# Patient Record
Sex: Female | Born: 1985 | Race: White | Hispanic: No | Marital: Married | Smoking: Never smoker
Health system: Southern US, Community
[De-identification: ages and names within clinical notes are randomized; demographics above are authoritative.]

## PROBLEM LIST (undated history)

## (undated) DIAGNOSIS — Z8619 Personal history of other infectious and parasitic diseases: Secondary | ICD-10-CM

## (undated) DIAGNOSIS — O24419 Gestational diabetes mellitus in pregnancy, unspecified control: Secondary | ICD-10-CM

## (undated) DIAGNOSIS — Z8639 Personal history of other endocrine, nutritional and metabolic disease: Secondary | ICD-10-CM

## (undated) DIAGNOSIS — F329 Major depressive disorder, single episode, unspecified: Secondary | ICD-10-CM

## (undated) DIAGNOSIS — F32A Depression, unspecified: Secondary | ICD-10-CM

## (undated) DIAGNOSIS — E079 Disorder of thyroid, unspecified: Secondary | ICD-10-CM

## (undated) DIAGNOSIS — T7840XA Allergy, unspecified, initial encounter: Secondary | ICD-10-CM

## (undated) HISTORY — DX: Allergy, unspecified, initial encounter: T78.40XA

## (undated) HISTORY — DX: Major depressive disorder, single episode, unspecified: F32.9

## (undated) HISTORY — DX: Disorder of thyroid, unspecified: E07.9

## (undated) HISTORY — DX: Personal history of other endocrine, nutritional and metabolic disease: Z86.39

## (undated) HISTORY — DX: Gestational diabetes mellitus in pregnancy, unspecified control: O24.419

## (undated) HISTORY — DX: Depression, unspecified: F32.A

## (undated) HISTORY — DX: Personal history of other infectious and parasitic diseases: Z86.19

---

## 2011-02-27 HISTORY — PX: OTHER SURGICAL HISTORY: SHX169

## 2016-08-01 DIAGNOSIS — E669 Obesity, unspecified: Secondary | ICD-10-CM | POA: Insufficient documentation

## 2016-08-01 LAB — BASIC METABOLIC PANEL
BUN: 14 (ref 4–21)
Creatinine: 0.8 (ref 0.5–1.1)
Glucose: 91
Potassium: 4.3 (ref 3.4–5.3)
Sodium: 138 (ref 137–147)

## 2016-08-05 LAB — HM PAP SMEAR: HM Pap smear: NEGATIVE

## 2018-08-13 LAB — TSH: TSH: 3.33 (ref <=5.90)

## 2018-10-29 DIAGNOSIS — M5412 Radiculopathy, cervical region: Secondary | ICD-10-CM | POA: Insufficient documentation

## 2018-11-08 ENCOUNTER — Emergency Department: Payer: Managed Care, Other (non HMO)

## 2018-11-08 ENCOUNTER — Other Ambulatory Visit: Payer: Self-pay

## 2018-11-08 ENCOUNTER — Emergency Department
Admission: EM | Admit: 2018-11-08 | Discharge: 2018-11-08 | Disposition: A | Discharge status: Home / Self Care | Payer: Managed Care, Other (non HMO) | Attending: Emergency Medicine | Admitting: Emergency Medicine

## 2018-11-08 ENCOUNTER — Encounter: Payer: Self-pay | Admitting: Emergency Medicine

## 2018-11-08 DIAGNOSIS — M50223 Other cervical disc displacement at C6-C7 level: Secondary | ICD-10-CM | POA: Insufficient documentation

## 2018-11-08 DIAGNOSIS — M5412 Radiculopathy, cervical region: Secondary | ICD-10-CM | POA: Diagnosis not present

## 2018-11-08 DIAGNOSIS — M25511 Pain in right shoulder: Secondary | ICD-10-CM | POA: Diagnosis present

## 2018-11-08 MED ORDER — OXYCODONE-ACETAMINOPHEN 5-325 MG PO TABS: 1.0000 | ORAL_TABLET | ORAL | 0 refills | Status: DC | PRN

## 2018-11-08 MED ORDER — PREDNISONE 10 MG (48) PO TBPK: ORAL_TABLET | ORAL | 0 refills | Status: DC

## 2018-11-08 MED ORDER — ONDANSETRON HCL 4 MG/2ML IJ SOLN
4.0000 mg | Freq: Once | INTRAMUSCULAR | Status: AC
  Administered 2018-11-08: 4 mg via INTRAVENOUS
  Filled 2018-11-08: qty 2

## 2018-11-08 MED ORDER — MORPHINE SULFATE (PF) 4 MG/ML IV SOLN
4.0000 mg | Freq: Once | INTRAVENOUS | Status: AC
  Administered 2018-11-08: 4 mg via INTRAVENOUS
  Filled 2018-11-08: qty 1

## 2018-11-08 MED ORDER — DEXAMETHASONE SODIUM PHOSPHATE 10 MG/ML IJ SOLN
10.0000 mg | Freq: Once | INTRAMUSCULAR | Status: AC
  Administered 2018-11-08: 10 mg via INTRAVENOUS
  Filled 2018-11-08: qty 1

## 2018-11-08 NOTE — ED Notes (Signed)
Pt to ed with c/o neck and right shoulder pain that started Jan 1st, 2020.  Pt denies specific injury.

## 2018-11-08 NOTE — Discharge Instructions (Signed)
Follow-up with [facility].  Please call for an appointment.  Use your medications as prescribed.  Return to emergency department if worsening.  Apply ice to the back of your neck to decrease the swelling.  This may help with some of the nerve pain.

## 2018-11-08 NOTE — ED Provider Notes (Signed)
[facility] Emergency Department Provider Note  ____________________________________________   First MD Initiated Contact with Patient 11/08/18 1005     (approximate)  I have reviewed the triage vital signs and the nursing notes.   HISTORY  Chief Complaint Shoulder Pain    HPI Ann Cooper is a 33 y.o. female presents to the emergency department complaining of pain radiating down the right arm to the thumb.  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  She has taken a Medrol Dosepak, Mobic, muscle relaxer, and tramadol without any relief.  She states it hurts to pick objects up with that hand.  She denies any known injury.    History reviewed. No pertinent past medical history.  There are no active problems to display for this patient.   History reviewed. No pertinent surgical history.  Prior to Admission medications   Medication Sig Start Date End Date Taking? Authorizing Provider  oxyCODONE-acetaminophen (PERCOCET/ROXICET) 5-325 MG tablet Take 1 tablet by mouth every 4 (four) hours as needed for severe pain. 11/08/18   , Roselyn Bering, PA-C  predniSONE (STERAPRED UNI-PAK 48 TAB) 10 MG (48) TBPK tablet Take 6 pills for 2 days, 5 pills x 2d, 4 pills x 2d, 3 pills x 2d, 2 pills x 2d, 1 pill x 2d 11/08/18   Faythe Ghee, PA-C    Allergies Patient has no known allergies.  No family history on file.  Social History Social History   Tobacco Use  . Smoking status: Not on file  Substance Use Topics  . Alcohol use: Not on file  . Drug use: Not on file    Review of Systems  Constitutional: No fever/chills Eyes: No visual changes. ENT: No sore throat. Respiratory: Denies cough Genitourinary: Negative for dysuria. Musculoskeletal: Negative for back pain.  Positive for right shoulder pain with radiation to the right thumb Skin: Negative for rash.    ____________________________________________   PHYSICAL EXAM:  VITAL  SIGNS: ED Triage Vitals [11/08/18 0943]  Enc Vitals Group     BP (!) 144/83     Pulse Rate 95     Resp 20     Temp 98.3 F (36.8 C)     Temp Source Oral     SpO2 100 %     Weight 200 lb (90.7 kg)     Height 5\' 4"  (1.626 m)     Head Circumference      Peak Flow      Pain Score 10     Pain Loc      Pain Edu?      Excl. in GC?     Constitutional: Alert and oriented. Well appearing and in no acute distress. Eyes: Conjunctivae are normal.  Head: Atraumatic. Nose: No congestion/rhinnorhea. Mouth/Throat: Mucous membranes are moist.   Neck:  supple no lymphadenopathy noted Cardiovascular: Normal rate, regular rhythm. Heart sounds are normal Respiratory: Normal respiratory effort.  No retractions, lungs c t a  GU: deferred Musculoskeletal: FROM all extremities, warm and well perfused, C-spine palpation reproduces pain radiating to the right thumb, grip is slightly decreased in the right hand compared to the left Neurologic:  Normal speech and language.  Skin:  Skin is warm, dry and intact. No rash noted. Psychiatric: Mood and affect are normal. Speech and behavior are normal.  ____________________________________________   LABS (all labs ordered are listed, but only abnormal results are displayed)  Labs Reviewed - No data to display ____________________________________________  ____________________________________________  RADIOLOGY  X-ray of C-spine shows degenerative changes at C6-C7 MRI of the C-spine without contrast shows disc bulging more so on the right side.  ____________________________________________   PROCEDURES  Procedure(s) performed: Saline lock, morphine 4 mg IV, Zofran 4 mg IV, Decadron 10 mg IV, repeat 4 mg of morphine prior to discharge.  Procedures    ____________________________________________   INITIAL IMPRESSION / ASSESSMENT AND PLAN / ED COURSE  Pertinent labs & imaging results that were available during my care of the patient were  reviewed by me and considered in my medical decision making (see chart for details).   Patient is a 33 year old female presents emergency department complaining of right shoulder pain that radiates to the right thumb.  Physical exam shows that pain is reproduced with palpation of the C-spine.  It caused pain into the right thumb.  She was given pain medication via IV, morphine 4 mg IV, Zofran 4 mg IV, and Decadron 10 mg IV.  X-ray of the C-spine shows degenerative changes. MRI showed bulging disc at C6-C7 with the bulge being worse on the right side.  These results were discussed with the patient and her husband.  She was given a another dose of morphine prior to discharge as the pain had returned.  She was given a prescription for Sterapred, she is to use the muscle relaxer that the urgent care gave her, and she was also given Percocet for pain.  She is to follow-up with Dr. Marcell Barlow at [facility].  She is to call make an appointment.  She states she understands will comply.  She was discharged in stable condition in the care of her husband.     As part of my medical decision making, I reviewed the following data within the electronic MEDICAL RECORD NUMBER History obtained from family, Nursing notes reviewed and incorporated, Old chart reviewed, Radiograph reviewed x-ray of C-spine shows degenerative changes, MRI shows disc bulging, Notes from prior ED visits and [place] Controlled Substance Database  ____________________________________________   FINAL CLINICAL IMPRESSION(S) / ED DIAGNOSES  Final diagnoses:  Cervical radiculitis      NEW MEDICATIONS STARTED DURING THIS VISIT:  Discharge Medication List as of 11/08/2018  2:02 PM    START taking these medications   Details  oxyCODONE-acetaminophen (PERCOCET/ROXICET) 5-325 MG tablet Take 1 tablet by mouth every 4 (four) hours as needed for severe pain., Starting Sat 11/08/2018, Normal    predniSONE (STERAPRED UNI-PAK 48 TAB) 10 MG (48)  TBPK tablet Take 6 pills for 2 days, 5 pills x 2d, 4 pills x 2d, 3 pills x 2d, 2 pills x 2d, 1 pill x 2d, Normal         Note:  This document was prepared using Dragon voice recognition software and may include unintentional dictation errors.    Faythe Ghee, PA-C 11/08/18 1536    Don Perking, [place], MD 11/09/18 [phone]

## 2018-11-08 NOTE — ED Triage Notes (Signed)
R shoulder pain radiating down R arm since January 1. Increases with movement. Denies injury at onset.

## 2018-11-27 ENCOUNTER — Ambulatory Visit: Payer: Self-pay | Admitting: Family Medicine

## 2018-12-16 ENCOUNTER — Ambulatory Visit: Payer: Managed Care, Other (non HMO) | Admitting: Family Medicine

## 2018-12-16 ENCOUNTER — Encounter: Payer: Self-pay | Admitting: Family Medicine

## 2018-12-16 VITALS — BP 114/72 | HR 91 | Temp 98.6°F | Ht 64.25 in | Wt 201.5 lb

## 2018-12-16 DIAGNOSIS — M5412 Radiculopathy, cervical region: Secondary | ICD-10-CM | POA: Diagnosis not present

## 2018-12-16 DIAGNOSIS — Z3169 Encounter for other general counseling and advice on procreation: Secondary | ICD-10-CM | POA: Diagnosis not present

## 2018-12-16 NOTE — Patient Instructions (Signed)
You are doing great with exercise and healthy eating!   Good luck with getting pregnant. If you get to 6 months you may want to consider using an App to track your cycle and ovulation.   Let me or you OB/GYN know if you reach 1 year of trying without conceiving  Continue taking prenatal vitamins

## 2018-12-16 NOTE — Progress Notes (Signed)
Subjective:     Ann Cooper is a 33 y.o. female presenting for Establish Care (previous PCP in [place] and last seen there 2010. Has been seen GYN in [place] and specialists.); ER follow up (was seen at ER on 11/08/2018 and was diagnosed with cervical radiculitis, bulging disc C6-C7. Has neurosurgeon appointment scheduled. Muscle relaxer is helping.); and History of Hypothyroidism (pt states she was diagnosed in 2014 and was on medication. She was not consistent with taking medication for this and eventually stopped. last time TSH was checked she was told it was normal. She is wondering if this needs to be re checked.)     HPI  #C6-7 radiculitis - neck feels better - seeing neurosurgery - feels like her mid-back will feel tingling or seize up occasionally - taking muscle relaxer's which is helping - happens a few times a week -   #Hx of Hypothyroidism - [facility] exam and numbers were "off" - was on a very low dose of 25 mcg and she forgot to take it frequently - last year the blood work was normal  # currently trying to become pregnant - started trying in October - not following her cycle currently - taking a prenatal vitamin - has regular cycles -  Review of Systems  Constitutional: Negative for chills and fever.  Respiratory: Negative for shortness of breath.   Cardiovascular: Negative for chest pain and palpitations.  Gastrointestinal: Negative for abdominal pain.  Neurological: Negative for weakness and numbness.     Social History   Tobacco Use  Smoking Status Never Smoker  Smokeless Tobacco Never Used        Objective:    BP Readings from Last 3 Encounters:  12/16/18 114/72  11/08/18 132/71   Wt Readings from Last 3 Encounters:  12/16/18 201 lb 8 oz (91.4 kg)  11/08/18 200 lb (90.7 kg)  11/08/18 200 lb (90.7 kg)    BP 114/72   Pulse 91   Temp 98.6 F (37 C)   Ht 5' 4.25" (1.632 m)   Wt 201 lb 8 oz (91.4 kg)   LMP  12/11/2018   SpO2 98%   BMI 34.32 kg/m    Physical Exam Constitutional:      General: She is not in acute distress.    Appearance: She is well-developed. She is not diaphoretic.  HENT:     Right Ear: External ear normal.     Left Ear: External ear normal.     Nose: Nose normal.  Eyes:     Conjunctiva/sclera: Conjunctivae normal.  Neck:     Musculoskeletal: Neck supple.  Cardiovascular:     Rate and Rhythm: Normal rate and regular rhythm.     Heart sounds: No murmur.  Pulmonary:     Effort: Pulmonary effort is normal. No respiratory distress.     Breath sounds: Normal breath sounds. No wheezing.  Skin:    General: Skin is warm and dry.     Capillary Refill: Capillary refill takes less than 2 seconds.  Neurological:     Mental Status: She is alert. Mental status is at baseline.  Psychiatric:        Mood and Affect: Mood normal.        Behavior: Behavior normal.           Assessment & Plan:   Problem List Items Addressed This Visit      Nervous and Auditory   Cervical radiculopathy - Primary  S/p steroid injection w/ improvement. Following with neurosurgery.       Relevant Medications   tiZANidine (ZANAFLEX) 2 MG tablet    Other Visit Diagnoses    Encounter for preconception consultation         Discussed healthy diet/exercise and prenatal vitamins. Also discussed using App for ovulation tracking and following up if no pregnancy in 1 year to discuss referral options to be evaluated.   Recent TSH normal. No need for repeat. Could repeat annually or when pregnant.  Return in about 1 year (around 12/17/2019).  Lynnda Child, MD

## 2018-12-16 NOTE — Assessment & Plan Note (Signed)
S/p steroid injection w/ improvement. Following with neurosurgery.

## 2018-12-29 ENCOUNTER — Encounter: Payer: Self-pay | Admitting: Family Medicine

## 2018-12-29 NOTE — Telephone Encounter (Signed)
Pulled Flonase down for refill.

## 2018-12-29 NOTE — Telephone Encounter (Signed)
See other mychart message, patient does not need this at this time.

## 2019-01-12 ENCOUNTER — Encounter: Payer: Self-pay | Admitting: Family Medicine

## 2019-08-24 LAB — RESULTS CONSOLE HPV: CHL HPV: NEGATIVE

## 2019-08-24 LAB — HM PAP SMEAR: HM Pap smear: NEGATIVE

## 2019-11-05 ENCOUNTER — Encounter: Payer: Self-pay | Admitting: Family Medicine

## 2019-11-05 NOTE — Telephone Encounter (Signed)
Dr. Selena Batten, patient is scheduled for 11/19/2019 for CPE. Can these questions be addressed at that time or needs another appointment for this?

## 2019-11-19 ENCOUNTER — Encounter: Payer: Managed Care, Other (non HMO) | Admitting: Family Medicine

## 2019-11-26 ENCOUNTER — Ambulatory Visit (INDEPENDENT_AMBULATORY_CARE_PROVIDER_SITE_OTHER): Payer: Managed Care, Other (non HMO) | Admitting: Family Medicine

## 2019-11-26 ENCOUNTER — Other Ambulatory Visit: Payer: Self-pay

## 2019-11-26 ENCOUNTER — Encounter: Payer: Self-pay | Admitting: Family Medicine

## 2019-11-26 VITALS — BP 118/80 | HR 94 | Temp 98.3°F | Ht 64.0 in | Wt 204.0 lb

## 2019-11-26 DIAGNOSIS — Z0000 Encounter for general adult medical examination without abnormal findings: Secondary | ICD-10-CM | POA: Diagnosis not present

## 2019-11-26 DIAGNOSIS — M5412 Radiculopathy, cervical region: Secondary | ICD-10-CM

## 2019-11-26 DIAGNOSIS — E669 Obesity, unspecified: Secondary | ICD-10-CM | POA: Diagnosis not present

## 2019-11-26 DIAGNOSIS — N946 Dysmenorrhea, unspecified: Secondary | ICD-10-CM | POA: Insufficient documentation

## 2019-11-26 DIAGNOSIS — Z23 Encounter for immunization: Secondary | ICD-10-CM

## 2019-11-26 MED ORDER — TIZANIDINE HCL 2 MG PO TABS: 2.0000 mg | ORAL_TABLET | Freq: Four times a day (QID) | ORAL | 1 refills | Status: DC | PRN

## 2019-11-26 MED ORDER — NAPROXEN 250 MG PO TABS: 250.0000 mg | ORAL_TABLET | Freq: Two times a day (BID) | ORAL | 1 refills | Status: DC

## 2019-11-26 NOTE — Progress Notes (Signed)
Annual Exam   Chief Complaint:  Chief Complaint  Patient presents with  . Annual Exam    History of Present Illness:  [name] is a 34 y.o. No obstetric history on file. who LMP was Patient's last menstrual period was 11/09/2019., presents today for her annual examination.      Nutrition/Lifestyle Diet: tries to eat healthy Exercise: not currently exercising She does get adequate calcium and Vitamin D in her diet.  Social History   Tobacco Use  Smoking Status Never Smoker  Smokeless Tobacco Never Used   Social History   Substance and Sexual Activity  Alcohol Use Yes   Comment: once a month   Social History   Substance and Sexual Activity  Drug Use Never     Safety The patient wears seatbelts: yes.     The patient feels safe at home and in their relationships: yes.  General Health Dentist in the last year: Yes Eye doctor: yes   Cervical Cancer Screening (Age 49-65) Last Pap:  08/24/2019 - normal, neg HPV  Family History of Breast Cancer: no Family History of Ovarian Cancer: no   Sexual Health/Menses Her menses are regular every 28-30 days, lasting 5 day(s).  Dysmenorrhea severe, occurring starts 10 days before and stop with cycle. She does not have intermenstrual bleeding.  Pain feels like period cramps. Taking ibuprofen 800 mg every 4-6 hours. She had a normal [place].  Has been trying to conceive x 3 months, off birth control since May 2019  She is single partner, contraception - currently trying to get pregnant.   Weight Wt Readings from Last 3 Encounters:  11/26/19 204 lb (92.5 kg)  12/16/18 201 lb 8 oz (91.4 kg)  11/08/18 200 lb (90.7 kg)   Patient has high BMI  BMI Readings from Last 1 Encounters:  11/26/19 35.02 kg/m     Chronic disease screening Blood pressure monitoring:  BP Readings from Last 3 Encounters:  11/26/19 118/80  12/16/18 114/72  11/08/18 132/71     Lipid Monitoring: Indication for screening: age >12, obesity,  diabetes, family hx, CV risk factors.  Lipid screening: Yes  No results found for: CHOL, HDL, LDLCALC, LDLDIRECT, TRIG, CHOLHDL   Diabetes Screening: age >61, overweight, family hx, PCOS, hx of gestational diabetes, at risk ethnicity, elevated blood pressure >135/80.  Diabetes Screening screening: Yes  No results found for: HGBA1C    Past Medical History:  Diagnosis Date  . Allergy   . Depression   . History of chicken pox   . History of hypothyroidism   . Thyroid disease     Past Surgical History:  Procedure Laterality Date  . NPFL left knee  02/2011   and menscus repair    Prior to Admission medications   Medication Sig Start Date End Date Taking? Authorizing Provider  fluticasone (FLONASE) 50 MCG/ACT nasal spray Place 1 spray into the nose daily as needed.  01/15/18  Yes [provider]  meloxicam (MOBIC) 15 MG tablet Take 15 mg by mouth daily. 11/18/19  Yes [provider]  Prenatal Vit-Fe Fumarate-FA (PRENATAL FA PO) Take by mouth daily.   Yes [provider]  tiZANidine (ZANAFLEX) 2 MG tablet Take by mouth every 6 (six) hours as needed for muscle spasms.   Yes [provider]    Allergies  Allergen Reactions  . Tree Extract     Tree nuts- ear and throat gets itchy.     Gynecologic History: Patient's last menstrual period was 11/09/2019.  Obstetric History: No obstetric history on file.  Social History   Socioeconomic History  . Marital status: Married    Spouse name: Shanon Brow  . Number of children: Not on file  . Years of education: Bachelors degree  . Highest education level: Not on file  Occupational History  . Not on file  Tobacco Use  . Smoking status: Never Smoker  . Smokeless tobacco: Never Used  Substance and Sexual Activity  . Alcohol use: Yes    Comment: once a month  . Drug use: Never  . Sexual activity: Yes    Birth control/protection: None  Other Topics Concern  . Not on file  Social History Narrative    Lives with Shanon Brow   Pets: dog, cat, and bird   Works at [employer]   Enjoys: walks with the dog, watching sports, puzzles   Exercise: started back at the [facility] - 5 times a week, walking on the treadmill 3.2 miles   Diet: pretty healthy, whole foods delivery - low carbs, meat/fruit/veggies   Social Determinants of Health   Financial Resource Strain: Low Risk   . Difficulty of Paying Living Expenses: Not hard at all  Food Insecurity:   . Worried About Charity fundraiser in the Last Year: Not on file  . Ran Out of Food in the Last Year: Not on file  Transportation Needs:   . Lack of Transportation (Medical): Not on file  . Lack of Transportation (Non-Medical): Not on file  Physical Activity:   . Days of Exercise per Week: Not on file  . Minutes of Exercise per Session: Not on file  Stress:   . Feeling of Stress : Not on file  Social Connections:   . Frequency of Communication with Friends and Family: Not on file  . Frequency of Social Gatherings with Friends and Family: Not on file  . Attends Religious Services: Not on file  . Active Member of Clubs or Organizations: Not on file  . Attends Archivist Meetings: Not on file  . Marital Status: Not on file  Intimate Partner Violence:   . Fear of Current or Ex-Partner: Not on file  . Emotionally Abused: Not on file  . Physically Abused: Not on file  . Sexually Abused: Not on file    Family History  Problem Relation Age of Onset  . Arthritis Mother   . Asthma Mother   . Depression Mother   . Miscarriages / [place] Mother   . Asthma Sister   . Depression Sister   . Arthritis Maternal Grandmother   . Asthma Maternal Grandmother   . Depression Maternal Grandmother   . Scoliosis Maternal Grandmother   . Alcohol abuse Maternal Grandfather   . Diabetes Maternal Grandfather     ROS   Physical Exam BP 118/80   Pulse 94   Temp 98.3 F (36.8 C)   Ht 5\' 4"  (1.626 m)   Wt 204 lb (92.5 kg)   LMP 11/09/2019   SpO2  97%   BMI 35.02 kg/m    BP Readings from Last 3 Encounters:  11/26/19 118/80  12/16/18 114/72  11/08/18 132/71    Wt Readings from Last 3 Encounters:  11/26/19 204 lb (92.5 kg)  12/16/18 201 lb 8 oz (91.4 kg)  11/08/18 200 lb (90.7 kg)     Physical Exam    Results: Depression screen [facility] 2/9 12/16/2018  Decreased Interest 0  Down, Depressed, Hopeless 0  PHQ - 2 Score 0  Altered  sleeping 0  Tired, decreased energy 0  Change in appetite 0  Feeling bad or failure about yourself  0  Trouble concentrating 0  Moving slowly or fidgety/restless 0  Suicidal thoughts 0  PHQ-9 Score 0  Difficult doing work/chores Not difficult at all      Assessment: 34 y.o. No obstetric history on file. female here for routine annual examination.  Plan: Problem List Items Addressed This Visit      Nervous and Auditory   Cervical radiculopathy   Relevant Medications   tiZANidine (ZANAFLEX) 2 MG tablet   Other Relevant Orders   Comprehensive metabolic panel   Hemoglobin A1c   Lipid panel     Other   Obesity (BMI 30.0-34.9)   Relevant Orders   Comprehensive metabolic panel   Hemoglobin A1c   Lipid panel   Menstrual cramp    Pt taking meloxicam for another injury - recommended only this for her next cycle. Trying to get pregnant so not a candidate for birth control at this time. If no improvement will trial Naproxen and consider opiate for severe symptoms. Also discussed possibility of Pelvic PT for help, though denies chronic symptoms.       Relevant Medications   naproxen (NAPROSYN) 250 MG tablet   Other Relevant Orders   Comprehensive metabolic panel   Hemoglobin A1c   Lipid panel    Other Visit Diagnoses    Annual physical exam    -  Primary   Relevant Orders   Comprehensive metabolic panel   Hemoglobin A1c   Lipid panel       Screening: -- Blood pressure screen normal -- cholesterol screening: will obtain -- Weight screening: overweight: continue to monitor --  Diabetes Screening: will obtain -- Nutrition: normal - encouraged healthy diet   Psych -- Depression screening (PHQ-9):    Office Visit from 12/16/2018 in [facility]  PHQ-9 Total Score  0       Safety -- tobacco screening: not using -- alcohol screening:  low-risk usage. -- no evidence of domestic violence or intimate partner violence.   Cancer Screening -- pap smear not collected per ASCCP guidelines -- family history of breast cancer screening: done. not at high risk.   Immunizations -- flu vaccine up to date -- TDAP q10 years up to date  Encouraged regular vision and dental and healthy eating   Lynnda Child

## 2019-11-26 NOTE — Patient Instructions (Signed)
#Menstrual Cramps - This time, take the Meloxicam (once daily) - take Tylenol 1000 mg every 8 hours as needed   - Do not take ibuprofen or naproxen while on Meloxicam - if no improvement on meloxicam - Then try 250-500 mg of Naproxen twice daily with next episode  I will let you know if the PT I know thinks that pelvic floor PT would be helpful   Preventive Care 51-36 Years Old, Female Preventive care refers to visits with your health care provider and lifestyle choices that can promote health and wellness. This includes:  A yearly physical exam. This may also be called an annual well check.  Regular dental visits and eye exams.  Immunizations.  Screening for certain conditions.  Healthy lifestyle choices, such as eating a healthy diet, getting regular exercise, not using drugs or products that contain nicotine and tobacco, and limiting alcohol use. What can I expect for my preventive care visit? Physical exam Your health care provider will check your:  Height and weight. This may be used to calculate body mass index (BMI), which tells if you are at a healthy weight.  Heart rate and blood pressure.  Skin for abnormal spots. Counseling Your health care provider may ask you questions about your:  Alcohol, tobacco, and drug use.  Emotional well-being.  Home and relationship well-being.  Sexual activity.  Eating habits.  Work and work Statistician.  Method of birth control.  Menstrual cycle.  Pregnancy history. What immunizations do I need?  Influenza (flu) vaccine  This is recommended every year. Tetanus, diphtheria, and pertussis (Tdap) vaccine  You may need a Td booster every 10 years. Varicella (chickenpox) vaccine  You may need this if you have not been vaccinated. Human papillomavirus (HPV) vaccine  If recommended by your health care provider, you may need three doses over 6 months. Measles, mumps, and rubella (MMR) vaccine  You may need at least  one dose of MMR. You may also need a second dose. Meningococcal conjugate (MenACWY) vaccine  One dose is recommended if you are age 10-21 years and a first-year college student living in a residence hall, or if you have one of several medical conditions. You may also need additional booster doses. Pneumococcal conjugate (PCV13) vaccine  You may need this if you have certain conditions and were not previously vaccinated. Pneumococcal polysaccharide (PPSV23) vaccine  You may need one or two doses if you smoke cigarettes or if you have certain conditions. Hepatitis A vaccine  You may need this if you have certain conditions or if you travel or work in places where you may be exposed to hepatitis A. Hepatitis B vaccine  You may need this if you have certain conditions or if you travel or work in places where you may be exposed to hepatitis B. Haemophilus influenzae type b (Hib) vaccine  You may need this if you have certain conditions. You may receive vaccines as individual doses or as more than one vaccine together in one shot (combination vaccines). Talk with your health care provider about the risks and benefits of combination vaccines. What tests do I need?  Blood tests  Lipid and cholesterol levels. These may be checked every 5 years starting at age 67.  Hepatitis C test.  Hepatitis B test. Screening  Diabetes screening. This is done by checking your blood sugar (glucose) after you have not eaten for a while (fasting).  Sexually transmitted disease (STD) testing.  BRCA-related cancer screening. This may be done if you  have a family history of breast, ovarian, tubal, or peritoneal cancers.  Pelvic exam and Pap test. This may be done every 3 years starting at age 65. Starting at age 92, this may be done every 5 years if you have a Pap test in combination with an HPV test. Talk with your health care provider about your test results, treatment options, and if necessary, the need  for more tests. Follow these instructions at home: Eating and drinking   Eat a diet that includes fresh fruits and vegetables, whole grains, lean protein, and low-fat dairy.  Take vitamin and mineral supplements as recommended by your health care provider.  Do not drink alcohol if: ? Your health care provider tells you not to drink. ? You are pregnant, may be pregnant, or are planning to become pregnant.  If you drink alcohol: ? Limit how much you have to 0-1 drink a day. ? Be aware of how much alcohol is in your drink. In the [place] one drink equals one 12 oz bottle of beer (355 mL), one 5 oz glass of wine (148 mL), or one 1 oz glass of hard liquor (44 mL). Lifestyle  Take daily care of your teeth and gums.  Stay active. Exercise for at least 30 minutes on 5 or more days each week.  Do not use any products that contain nicotine or tobacco, such as cigarettes, e-cigarettes, and chewing tobacco. If you need help quitting, ask your health care provider.  If you are sexually active, practice safe sex. Use a condom or other form of birth control (contraception) in order to prevent pregnancy and STIs (sexually transmitted infections). If you plan to become pregnant, see your health care provider for a preconception visit. What's next?  Visit your health care provider once a year for a well check visit.  Ask your health care provider how often you should have your eyes and teeth checked.  Stay up to date on all vaccines. This information is not intended to replace advice given to you by your health care provider. Make sure you discuss any questions you have with your health care provider. Document Revised: 06/26/2018 Document Reviewed: 06/26/2018 Elsevier Patient Education  2020 [employer].

## 2019-11-26 NOTE — Assessment & Plan Note (Signed)
Pt taking meloxicam for another injury - recommended only this for her next cycle. Trying to get pregnant so not a candidate for birth control at this time. If no improvement will trial Naproxen and consider opiate for severe symptoms. Also discussed possibility of Pelvic PT for help, though denies chronic symptoms.

## 2019-11-27 ENCOUNTER — Encounter: Payer: Self-pay | Admitting: Family Medicine

## 2019-11-27 LAB — COMPREHENSIVE METABOLIC PANEL
ALT: 60 IU/L — ABNORMAL HIGH (ref 0–32)
AST: 31 IU/L (ref 0–40)
Albumin/Globulin Ratio: 2.1 (ref 1.2–2.2)
Albumin: 4.8 g/dL (ref 3.8–4.8)
Alkaline Phosphatase: 62 IU/L (ref 39–117)
BUN/Creatinine Ratio: 17 (ref 9–23)
BUN: 12 mg/dL (ref 6–20)
Bilirubin Total: 0.4 mg/dL (ref 0.0–1.2)
CO2: 22 mmol/L (ref 20–29)
Calcium: 9.8 mg/dL (ref 8.7–10.2)
Chloride: 102 mmol/L (ref 96–106)
Creatinine, Ser: 0.71 mg/dL (ref 0.57–1.00)
GFR calc Af Amer: 127 mL/min/{1.73_m2} (ref >59)
GFR calc non Af Amer: 110 mL/min/{1.73_m2} (ref >59)
Globulin, Total: 2.3 g/dL (ref 1.5–4.5)
Glucose: 91 mg/dL (ref 65–99)
Potassium: 4.2 mmol/L (ref 3.5–5.2)
Sodium: 140 mmol/L (ref 134–144)
Total Protein: 7.1 g/dL (ref 6.0–8.5)

## 2019-11-27 LAB — LIPID PANEL
Chol/HDL Ratio: 3.2 ratio (ref 0.0–4.4)
Cholesterol, Total: 225 mg/dL — ABNORMAL HIGH (ref 100–199)
HDL: 71 mg/dL (ref >39)
LDL Chol Calc (NIH): 135 mg/dL — ABNORMAL HIGH (ref 0–99)
Triglycerides: 107 mg/dL (ref 0–149)
VLDL Cholesterol Cal: 19 mg/dL (ref 5–40)

## 2019-11-27 LAB — HEMOGLOBIN A1C
Est. average glucose Bld gHb Est-mCnc: 114 mg/dL
Hgb A1c MFr Bld: 5.6 % (ref 4.8–5.6)

## 2019-12-07 ENCOUNTER — Encounter: Payer: Self-pay | Admitting: Family Medicine

## 2020-01-18 ENCOUNTER — Encounter: Payer: Self-pay | Admitting: Family Medicine

## 2020-01-18 DIAGNOSIS — N946 Dysmenorrhea, unspecified: Secondary | ICD-10-CM

## 2020-01-18 MED ORDER — MELOXICAM 15 MG PO TABS: 15.0000 mg | ORAL_TABLET | Freq: Every day | ORAL | 3 refills | Status: DC

## 2020-01-18 NOTE — Addendum Note (Signed)
Addended by: Gweneth Dimitri R on: 01/18/2020 02:00 PM   Modules accepted: Orders

## 2020-06-03 LAB — OB RESULTS CONSOLE HEPATITIS B SURFACE ANTIGEN: Hepatitis B Surface Ag: NEGATIVE

## 2020-06-03 LAB — OB RESULTS CONSOLE VARICELLA ZOSTER ANTIBODY, IGG: Varicella: IMMUNE

## 2020-06-18 LAB — OB RESULTS CONSOLE RUBELLA ANTIBODY, IGM: Rubella: IMMUNE

## 2020-07-18 ENCOUNTER — Other Ambulatory Visit: Payer: Managed Care, Other (non HMO)

## 2020-07-18 ENCOUNTER — Other Ambulatory Visit: Payer: Self-pay

## 2020-07-18 DIAGNOSIS — Z20822 Contact with and (suspected) exposure to covid-19: Secondary | ICD-10-CM

## 2020-07-19 LAB — SARS-COV-2, NAA 2 DAY TAT

## 2020-07-19 LAB — NOVEL CORONAVIRUS, NAA: SARS-CoV-2, NAA: NOT DETECTED

## 2020-07-22 ENCOUNTER — Other Ambulatory Visit: Payer: Managed Care, Other (non HMO)

## 2020-07-25 ENCOUNTER — Other Ambulatory Visit: Payer: Managed Care, Other (non HMO)

## 2020-07-25 DIAGNOSIS — Z20822 Contact with and (suspected) exposure to covid-19: Secondary | ICD-10-CM

## 2020-07-26 LAB — SARS-COV-2, NAA 2 DAY TAT

## 2020-07-26 LAB — NOVEL CORONAVIRUS, NAA: SARS-CoV-2, NAA: NOT DETECTED

## 2020-09-30 ENCOUNTER — Encounter: Payer: Self-pay | Admitting: *Deleted

## 2020-09-30 ENCOUNTER — Encounter: Payer: Managed Care, Other (non HMO) | Attending: Obstetrics & Gynecology | Admitting: *Deleted

## 2020-09-30 ENCOUNTER — Other Ambulatory Visit: Payer: Self-pay

## 2020-09-30 VITALS — BP 120/68 | Ht 64.0 in | Wt 212.6 lb

## 2020-09-30 DIAGNOSIS — O24410 Gestational diabetes mellitus in pregnancy, diet controlled: Secondary | ICD-10-CM

## 2020-09-30 DIAGNOSIS — O24419 Gestational diabetes mellitus in pregnancy, unspecified control: Secondary | ICD-10-CM | POA: Diagnosis not present

## 2020-09-30 NOTE — Patient Instructions (Signed)
Read booklet on Gestational Diabetes Follow Gestational Meal Planning Guidelines Avoid sugar sweetened drinks (juice) Complete a 3 Day Food Record and bring to next appointment Check blood sugars 4 x day - before breakfast and 2 hrs after every meal and record  Bring blood sugar log to all appointments Call MD for prescription for meter strips and lancets Strips  One Touch Verio Lancets   One Touch Delica Plus Purchase urine ketone strips if instructed by MD and check urine ketones every am:  If + increase bedtime snack to 1 protein and 2 carbohydrate servings Walk 20-30 minutes at least 5 x week if permitted by MD

## 2020-09-30 NOTE — Progress Notes (Signed)
Diabetes Self-Management Education  Visit Type: First/Initial  Appt. Start Time: 1330 Appt. End Time: 1500  09/30/2020  [name], identified by name and date of birth, is a 34 y.o. female with a diagnosis of Diabetes: Gestational Diabetes.   ASSESSMENT  Blood pressure 120/68, height 5\' 4"  (1.626 m), weight 212 lb 9.6 oz (96.4 kg), last menstrual period 03/17/2020, estimated date of delivery 12/22/2020 Body mass index is 36.49 kg/m.   Diabetes Self-Management Education - 09/30/20 1432      Visit Information   Visit Type First/Initial      Initial Visit   Diabetes Type Gestational Diabetes    Are you currently following a meal plan? Yes    What type of meal plan do you follow? "low carbs"    Are you taking your medications as prescribed? Yes    Date Diagnosed 1 week ago      Health Coping   How would you rate your overall health? Fair      Psychosocial Assessment   Patient Belief/Attitude about Diabetes Motivated to manage diabetes   "annoyed"   Self-care barriers None    Self-management support Doctor's office;Family    Other persons present Spouse/SO    Patient Concerns Nutrition/Meal planning;Glycemic Control;Monitoring;Weight Control;Healthy Lifestyle    Special Needs None    Preferred Learning Style Visual;Hands on    Learning Readiness Change in progress    How often do you need to have someone help you when you read instructions, pamphlets, or other written materials from your doctor or pharmacy? 1 - Never    What is the last grade level you completed in school? Bachelors      Pre-Education Assessment   Patient understands the diabetes disease and treatment process. Needs Instruction    Patient understands incorporating nutritional management into lifestyle. Needs Instruction    Patient undertands incorporating physical activity into lifestyle. Needs Instruction    Patient understands using medications safely. Needs Instruction    Patient understands  monitoring blood glucose, interpreting and using results Needs Instruction    Patient understands prevention, detection, and treatment of acute complications. Needs Instruction    Patient understands prevention, detection, and treatment of chronic complications. Needs Instruction    Patient understands how to develop strategies to address psychosocial issues. Needs Instruction    Patient understands how to develop strategies to promote health/change behavior. Needs Instruction      Complications   Last HgB A1C per patient/outside source 5.5 %   06/03/2020   How often do you check your blood sugar? 0 times/day (not testing)   Provided One Touch Verio Flex meter and instructed on use. BG upon return demonstration was 79 mg/dL at 08/03/2020 pm - 2 hrs pp.   Have you had a dilated eye exam in the past 12 months? No    Have you had a dental exam in the past 12 months? No    Are you checking your feet? No      Dietary Intake   Breakfast eggs, bacon and avocado, whole wheat toast    Snack (morning) chips, boiled egg and cheese; 1:66 yogurt, fruit (apple, watermelon, oranges    Lunch frozen meal, grilled chicken salad, tuna fish    Dinner chicken, fish; potatoes, peas, beans, corn, rice, occasional pasta, lettuce, carrots, cuccumbers, broccolli, cauliflower, breen beans    Beverage(s) water, juice, coffee, unsweetened tea      Exercise   Exercise Type Light (walking / raking leaves)    How many  days per week to you exercise? 4    How many minutes per day do you exercise? 20    Total minutes per week of exercise 80      Patient Education   Previous Diabetes Education No    Disease state  Definition of diabetes, type 1 and 2, and the diagnosis of diabetes;Factors that contribute to the development of diabetes    Nutrition management  Role of diet in the treatment of diabetes and the relationship between the three main macronutrients and blood glucose level;Food label reading, portion sizes and measuring  food.;Reviewed blood glucose goals for pre and post meals and how to evaluate the patients' food intake on their blood glucose level.    Physical activity and exercise  Role of exercise on diabetes management, blood pressure control and cardiac health.    Medications Other (comment)   Limited use of oral medications during pregnancy and potential for insulin.   Monitoring Taught/evaluated SMBG meter.;Purpose and frequency of SMBG.;Taught/discussed recording of test results and interpretation of SMBG.;Identified appropriate SMBG and/or A1C goals.;Ketone testing, when, how.    Chronic complications Relationship between chronic complications and blood glucose control    Psychosocial adjustment Identified and addressed patients feelings and concerns about diabetes    Preconception care Pregnancy and GDM  Role of pre-pregnancy blood glucose control on the development of the fetus;Reviewed with patient blood glucose goals with pregnancy;Role of family planning for patients with diabetes      Individualized Goals (developed by patient)   Reducing Risk Other (comment)   improve blood sugars, prevent diabetes complications, lose weight, lead a healthier lifestyle, become more fit     Outcomes   Expected Outcomes Demonstrated interest in learning. Expect positive outcomes           Individualized Plan for Diabetes Self-Management Training:   Learning Objective:  Patient will have a greater understanding of diabetes self-management. Patient education plan is to attend individual and/or group sessions per assessed needs and concerns.   Plan:   Patient Instructions  Read booklet on Gestational Diabetes Follow Gestational Meal Planning Guidelines Avoid sugar sweetened drinks (juice) Complete a 3 Day Food Record and bring to next appointment Check blood sugars 4 x day - before breakfast and 2 hrs after every meal and record  Bring blood sugar log to all appointments Call MD for prescription for  meter strips and lancets Strips  One Touch Verio Lancets   One Touch Delica Plus Purchase urine ketone strips if instructed by MD and check urine ketones every am:  If + increase bedtime snack to 1 protein and 2 carbohydrate servings Walk 20-30 minutes at least 5 x week if permitted by MD  Expected Outcomes:  Demonstrated interest in learning. Expect positive outcomes  Education material provided:  Gestational Booklet Gestational Meal Planning Guidelines Simple Meal Plan Viewed Gestational Diabetes Video Meter = One Touch Verio Flex 3 Day Food Record Goals for a Healthy Pregnancy  If problems or questions, patient to contact team via:  Sharion Settler, RN, CCM, CDCES [phone]  Future DSME appointment:  October 06, 2020 with the dietitian

## 2020-10-06 ENCOUNTER — Encounter: Payer: Managed Care, Other (non HMO) | Admitting: Dietician

## 2020-10-06 ENCOUNTER — Other Ambulatory Visit: Payer: Self-pay

## 2020-10-06 ENCOUNTER — Encounter: Payer: Self-pay | Admitting: Dietician

## 2020-10-06 VITALS — BP 98/60 | Ht 64.0 in | Wt 212.5 lb

## 2020-10-06 DIAGNOSIS — O24419 Gestational diabetes mellitus in pregnancy, unspecified control: Secondary | ICD-10-CM | POA: Diagnosis not present

## 2020-10-06 DIAGNOSIS — O24410 Gestational diabetes mellitus in pregnancy, diet controlled: Secondary | ICD-10-CM

## 2020-10-06 NOTE — Progress Notes (Signed)
.   Patient's BG record indicates fasting BGs ranging 100-112 , and post-meal BGs ranging 83-157 . Patient's food diary indicates good nutrient balance at meals, some snacks missing protein source  . Provided individualized menu ideas based on patient's food preferences. . Instructed patient on food safety, including avoidance of Listeriosis, and limiting mercury from fish. . Discussed importance of maintaining healthy lifestyle habits to reduce risk of Type 2 DM as well as Gestational DM with any future pregnancies. . Advised patient to use any remaining testing supplies to test some BGs after delivery, and to have BG tested ideally annually, as well as prior to attempting future pregnancies.

## 2020-10-06 NOTE — Patient Instructions (Signed)
   Try having an evening snack of carb+ protein to see if that helps with FBGs   When snacking remember to include a protein source

## 2020-10-29 NOTE — L&D Delivery Note (Signed)
Delivery Note At 2:20 AM a viable female was delivered via Vaginal, Spontaneous (Presentation: Left Occiput Anterior).  APGAR: 8, 9; weight  .   Placenta status: Spontaneous, Intact.  Cord: 3 vessels with the following complications: None.  Cord pH:  Short second stage . Controlled delivery . Shoulder delivered without difficulty . Delayed cord clamping  Placenta shortly after . Repair uncomplicated  Anesthesia: Epidural Episiotomy: None Lacerations: 2nd degree;Perineal Suture Repair: 2.0 3.0 vicryl Est. Blood Loss (mL):  100 QBL pending  Mom to postpartum.  Baby to Couplet care / Skin to Skin.  Ann Cooper 12/16/2020, 2:37 AM

## 2020-11-01 ENCOUNTER — Encounter: Payer: Self-pay | Admitting: *Deleted

## 2020-11-01 ENCOUNTER — Encounter: Payer: Managed Care, Other (non HMO) | Admitting: *Deleted

## 2020-11-01 ENCOUNTER — Other Ambulatory Visit: Payer: Self-pay

## 2020-11-01 VITALS — BP 100/62 | Wt 212.2 lb

## 2020-11-01 DIAGNOSIS — O24414 Gestational diabetes mellitus in pregnancy, insulin controlled: Secondary | ICD-10-CM | POA: Insufficient documentation

## 2020-11-01 DIAGNOSIS — Z3A00 Weeks of gestation of pregnancy not specified: Secondary | ICD-10-CM | POA: Insufficient documentation

## 2020-11-01 NOTE — Patient Instructions (Addendum)
  Follow Gestational Meal Planning Guidelines Check blood sugars 4 x day - before breakfast and 2 hrs after every meal and record  Bring blood sugar log to MD appointments  Walk 20-30 minutes at least 5 x week if permitted by MD  Carry fast acting glucose and a snack at all times Rotate injection sites Give morning insulin 30 minutes before breakfast and evening insulin 30 minutes before supper  Morning insulin Humulin/Novolin    N  (cloudy)      16      units Humulin/Novolin    R  (clear)       10        units                                             26        units  Evening insulin Humulin/Novolin    N (cloudy)     6           units Humulin/Novolin    R (clear)      6           units                   12          units

## 2020-11-01 NOTE — Progress Notes (Signed)
Diabetes Self-Management Education  Visit Type: Follow-up  Appt. Start Time:10:05 Appt. End Time: 10:55  11/01/2020  [name], identified by name and date of birth, is a 35 y.o. female with a diagnosis of Diabetes: Gestational Diabetes.   ASSESSMENT  Blood pressure 100/62, weight 212 lb 3.2 oz (96.3 kg), last menstrual period 03/17/2020, estimated date of delivery 12/22/2020 Body mass index is 36.42 kg/m.   Diabetes Self-Management Education - 11/01/20 1100      Visit Information   Visit Type Follow-up      Initial Visit   Diabetes Type Gestational Diabetes      Complications   How often do you check your blood sugar? 3-4 times / week    Fasting Blood glucose range (mg/dL) 29-528   41-324 mg/dL - 40/10 elevated   Postprandial Blood glucose range (mg/dL) 27-253;664-403   pp breakfast 89-148 mg/dL 4/74 elevated; pp lunch 81-148 mg/dL 2/59 elevated; pp supper 95-149 mg/dL 5/63 elevated     Dietary Intake   Breakfast 3 meals and 2 snacks/day      Exercise   Exercise Type Light (walking / raking leaves)    How many days per week to you exercise? 5    How many minutes per day do you exercise? 20    Total minutes per week of exercise 100      Patient Education   Nutrition management  Reviewed blood glucose goals for pre and post meals and how to evaluate the patients' food intake on their blood glucose level.;Meal timing in regards to the patients' current diabetes medication.    Physical activity and exercise  Role of exercise on diabetes management, blood pressure control and cardiac health.    Medications Taught/reviewed insulin injection, site rotation, insulin storage and needle disposal.;Reviewed patients medication for diabetes, action, purpose, timing of dose and side effects.   Pt injected 5 units NS to left abdomen subcutaneously without difficulty.   Monitoring Purpose and frequency of SMBG.;Taught/discussed recording of test results and interpretation of SMBG.     Acute complications Taught treatment of hypoglycemia - the 15 rule.    Preconception care Reviewed with patient blood glucose goals with pregnancy      Post-Education Assessment   Patient understands incorporating nutritional management into lifestyle. Demonstrates understanding / competency    Patient undertands incorporating physical activity into lifestyle. Demonstrates understanding / competency    Patient understands using medications safely. Demonstrates understanding / competency    Patient understands monitoring blood glucose, interpreting and using results Demonstrates understanding / competency    Patient understands prevention, detection, and treatment of acute complications. Demonstrates understanding / competency      Outcomes   Expected Outcomes Demonstrated interest in learning. Expect positive outcomes    Program Status Completed      Subsequent Visit   Since your last visit have you continued or begun to take your medications as prescribed? Yes    Since your last visit have you had your blood pressure checked? Yes    Is your most recent blood pressure lower, unchanged, or higher since your last visit? Unchanged    Since your last visit have you experienced any weight changes? No change    Since your last visit, are you checking your blood glucose at least once a day? Yes           Individualized Plan for Diabetes Self-Management Training:   Learning Objective:  Patient will have a greater understanding of diabetes self-management. Patient education plan  is to attend individual and/or group sessions per assessed needs and concerns.   Plan:   Patient Instructions     Follow Gestational Meal Planning Guidelines Check blood sugars 4 x day - before breakfast and 2 hrs after every meal and record  Bring blood sugar log to MD appointments  Walk 20-30 minutes at least 5 x week if permitted by MD  Carry fast acting glucose and a snack at all times Rotate injection  sites Give morning insulin 30 minutes before breakfast and evening insulin 30 minutes before supper  Morning insulin Humulin/Novolin    N  (cloudy)      16      units Humulin/Novolin    R  (clear)       10        units                                             26        units  Evening insulin Humulin/Novolin    N (cloudy)     6           units Humulin/Novolin    R (clear)      6           units                   12          units  Expected Outcomes:  Demonstrated interest in learning. Expect positive outcomes  Education material provided:  Injection Guide (BD) Glucose tablets Symptoms, causes and treatments of Hypoglycemia  If problems or questions, patient to contact team via:  Sharion Settler, RN, CCM, CDCES [phone]  Future DSME appointment: PRN

## 2020-11-02 ENCOUNTER — Telehealth: Payer: Self-pay | Admitting: *Deleted

## 2020-11-02 NOTE — Telephone Encounter (Signed)
Phone call to follow up on insulin administration She reports doing well with injections and she showed her husband how to administer. She reports blood sugar last night after supper was 106 and fasting this morning was 86. She denies any symptoms of hypoglycemia but reports she woke up during the night feeling hungry but not enough to get out of bed to eat anything. Asked her to check her blood sugar if this happens again to make sure her sugar is not dropping and keep fast acting glucose on hand. She is eating a bedtime snack.

## 2020-11-22 LAB — OB RESULTS CONSOLE RPR: RPR: NONREACTIVE

## 2020-11-22 LAB — OB RESULTS CONSOLE GC/CHLAMYDIA
Chlamydia: NEGATIVE
Gonorrhea: NEGATIVE

## 2020-11-22 LAB — OB RESULTS CONSOLE GBS: GBS: NEGATIVE

## 2020-11-22 LAB — OB RESULTS CONSOLE HIV ANTIBODY (ROUTINE TESTING): HIV: NONREACTIVE

## 2020-11-29 IMAGING — CR DG CERVICAL SPINE 2 OR 3 VIEWS
1 series · 3 of 3 positions shown · non-contrast
Comparison: None.

CLINICAL DATA: Neck and right upper extremity pain. No known
injury.

EXAM:
CERVICAL SPINE - 2-3 VIEW

[Series 1: dg cervical spine 2 or 3 views · 0.14mm/px · 3 of 3 slices shown]
[im 1/3]
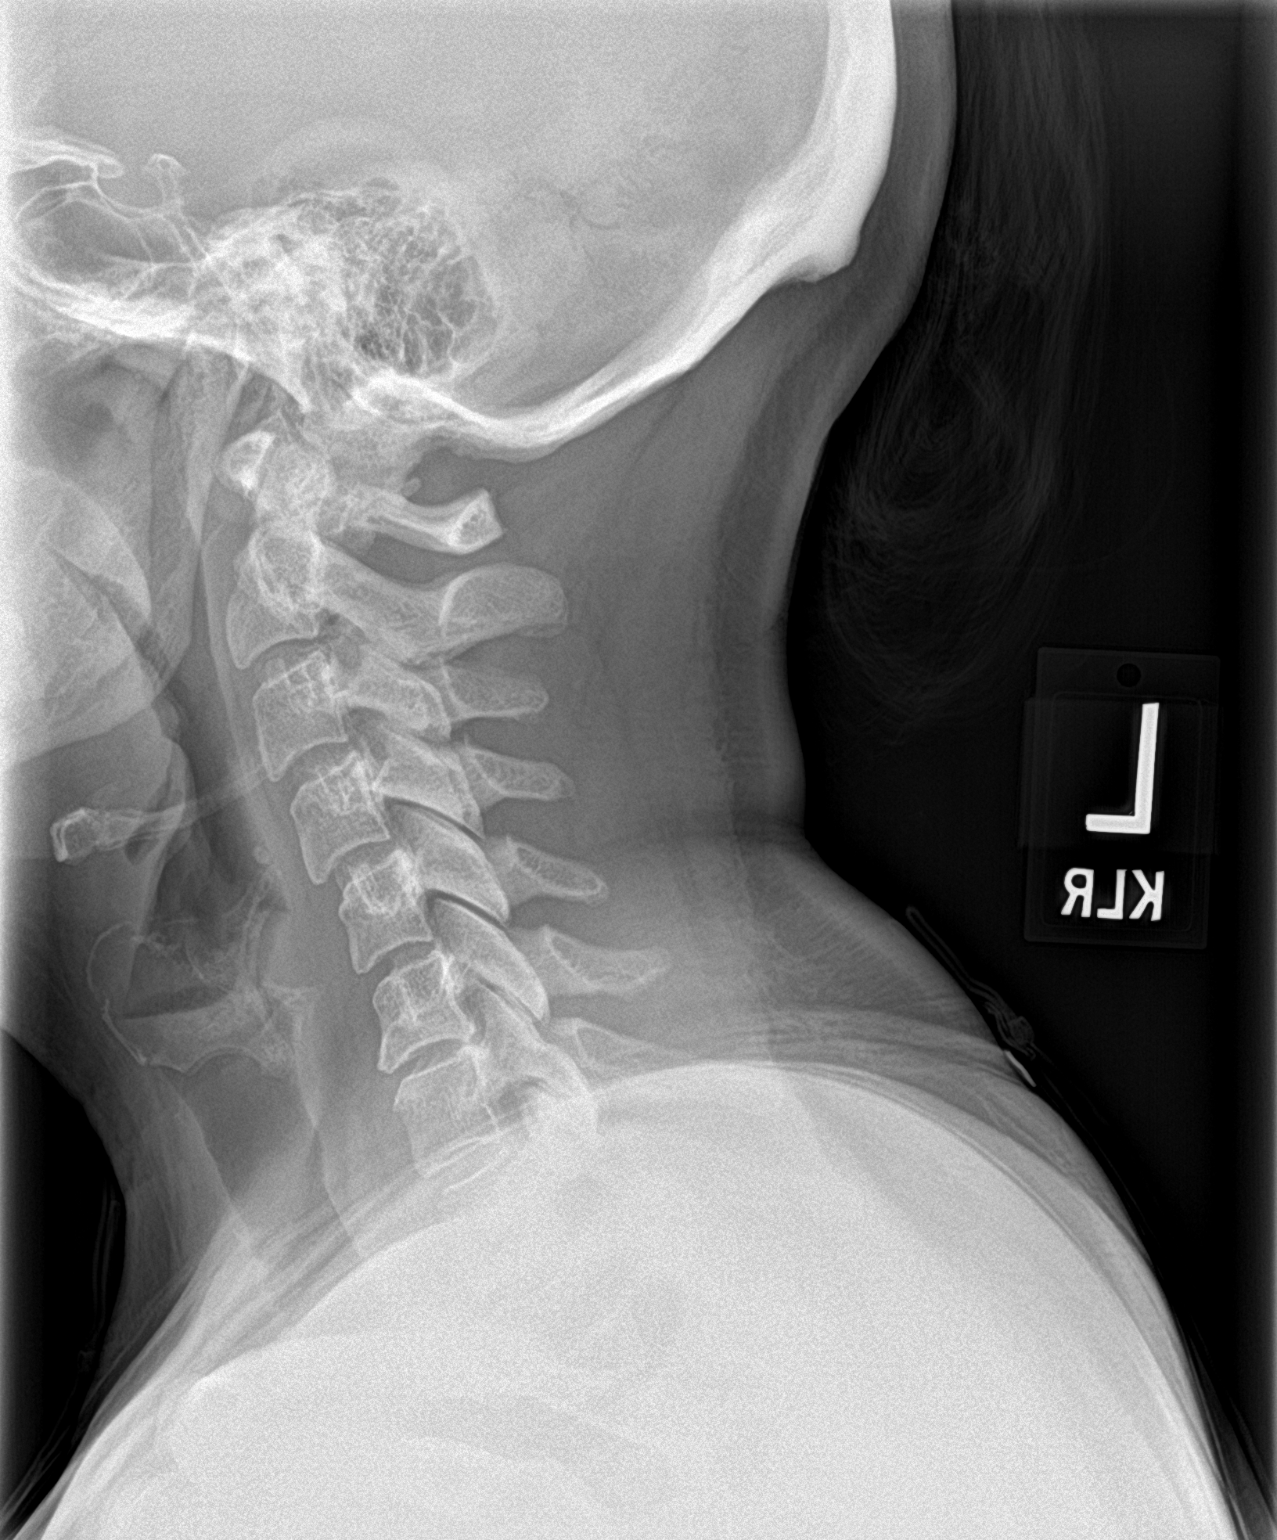
[im 2/3]
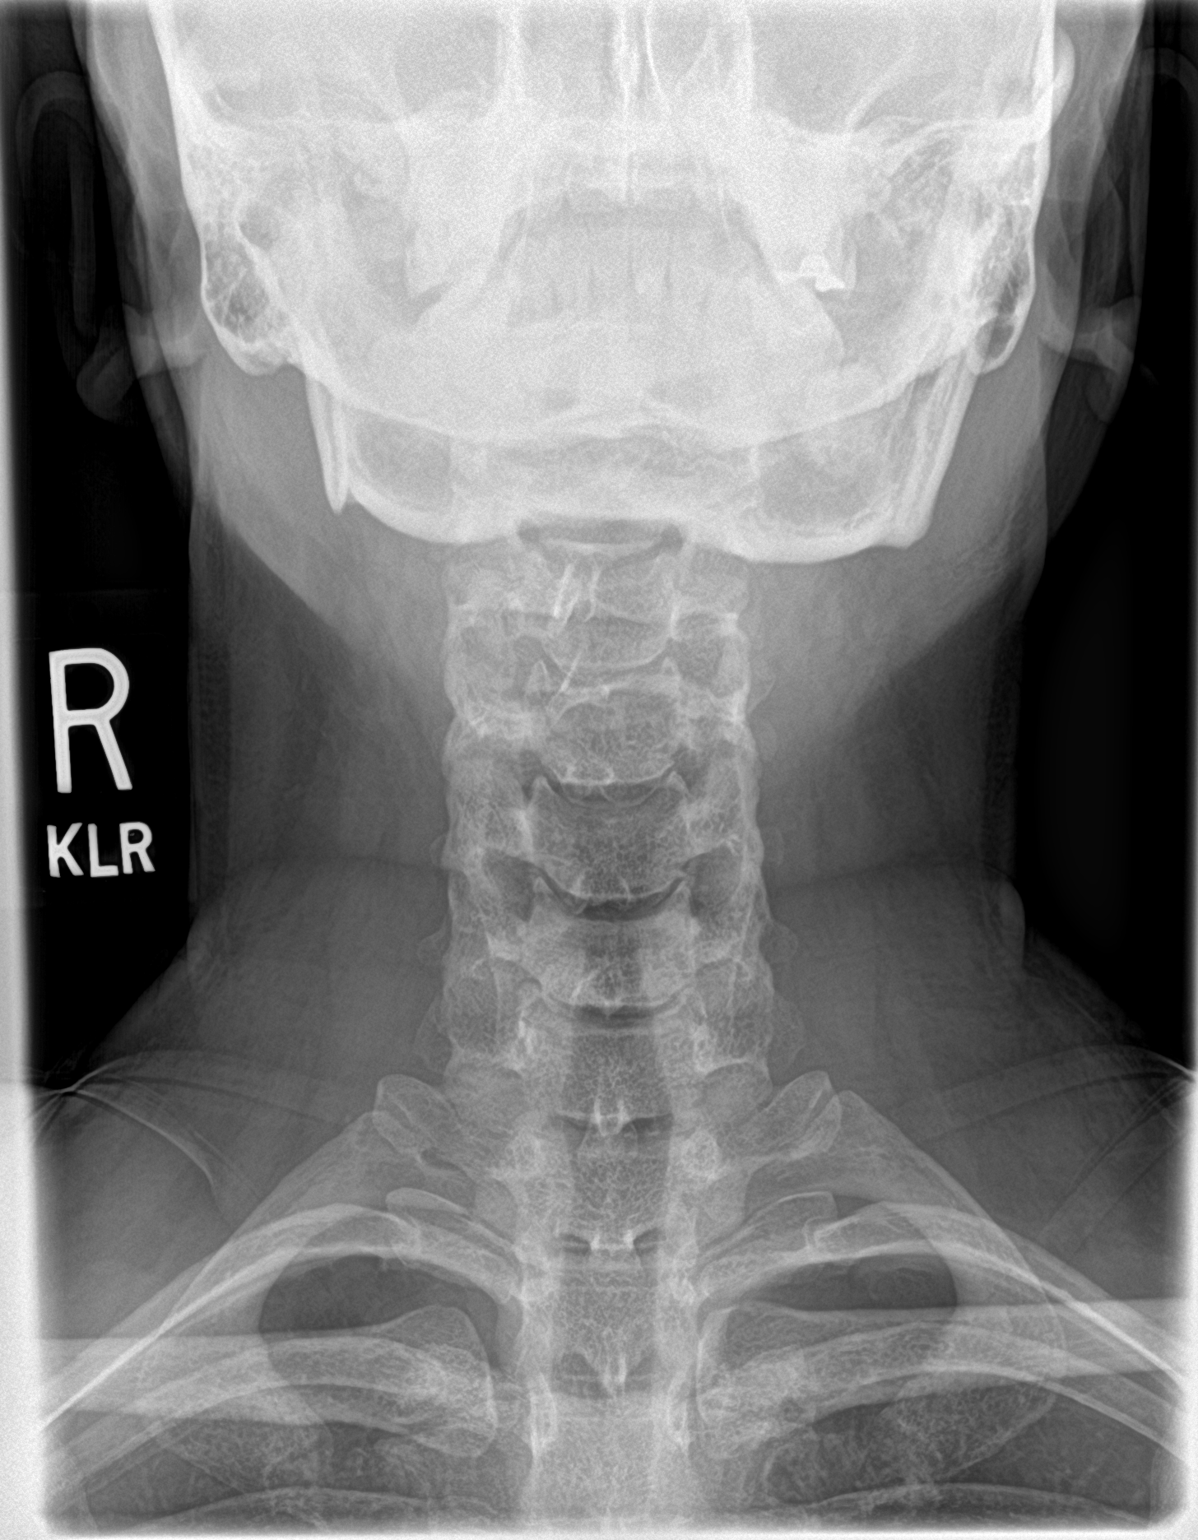
[im 3/3]
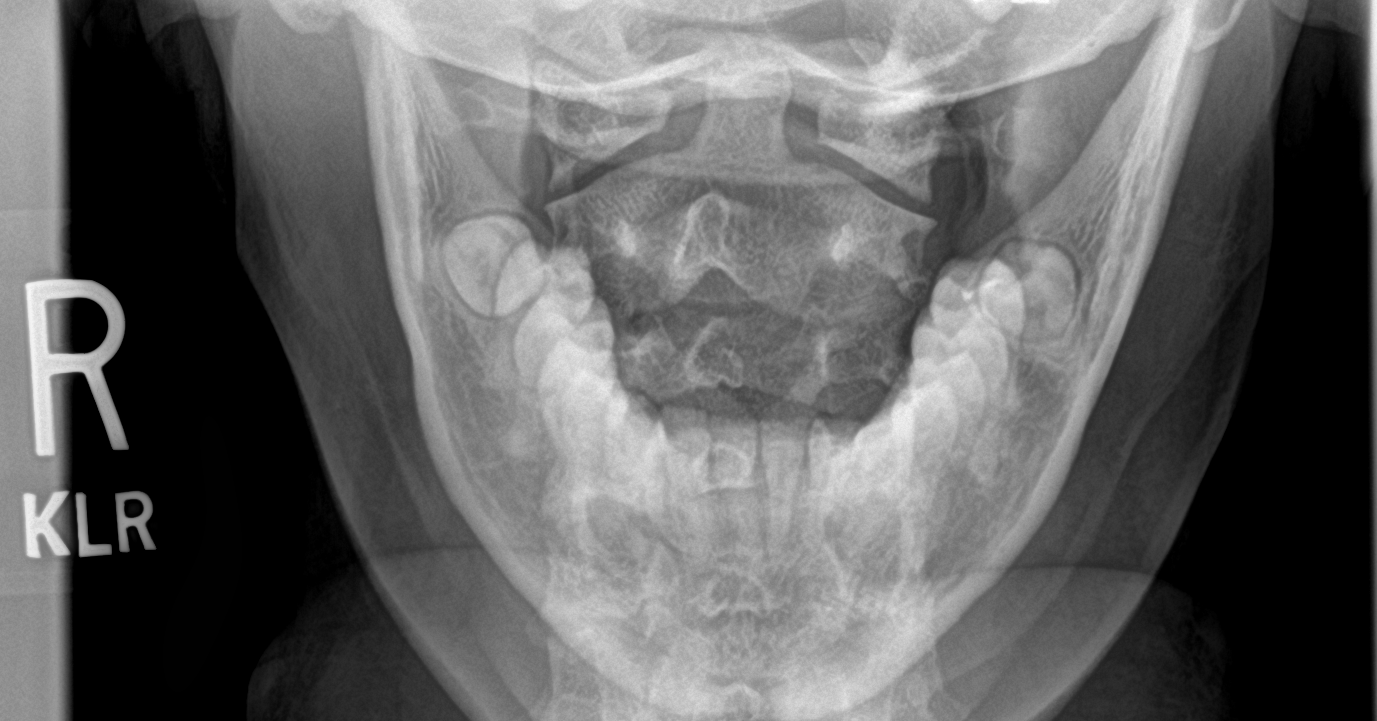

[3 of 3 positions shown; findings below may reference images not displayed]

FINDINGS: No fracture or spondylolisthesis is noted. Mild degenerative disc
disease is noted at C6-7 with minimal anterior osteophyte formation.
No prevertebral soft tissue swelling is noted.
IMPRESSION: Mild degenerative disc disease is noted at C6-7. No acute
abnormality seen in the cervical spine.

## 2020-11-29 IMAGING — MR MR CERVICAL SPINE W/O CM
5 series · 38 of 48 positions shown · non-contrast
Comparison: Prior radiograph from earlier the same day.

CLINICAL DATA: Initial evaluation for acute neck and right shoulder
pain radiating into the right upper extremity since 10/29/2018,
worsened with movement.

EXAM:
MRI CERVICAL SPINE WITHOUT CONTRAST
TECHNIQUE: Multiplanar, multisequence MR imaging of the cervical spine was
performed. No intravenous contrast was administered.

[Series 5: T2 · sagittal · 3.0mm · 0.62mm/px · 6 of 15 slices shown (1 of 2)]
[im 1/15]
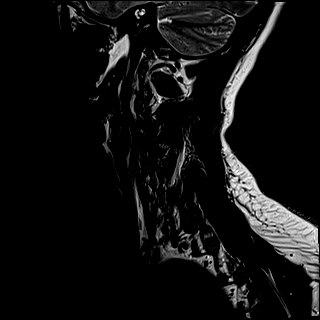
[im 3/15]
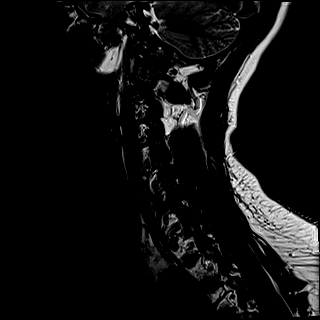
[im 6/15]
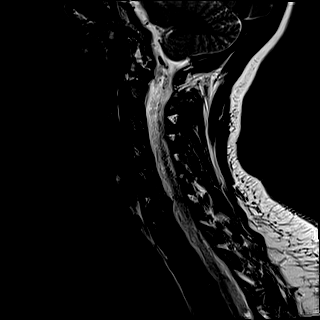
[im 9/15]
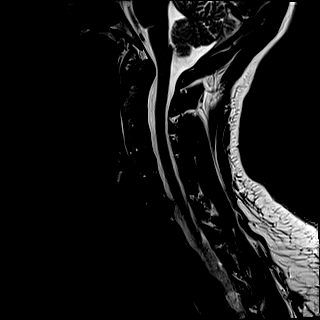
[im 12/15]
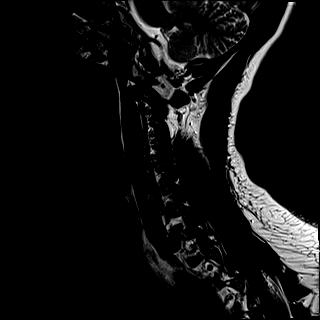
[im 15/15]
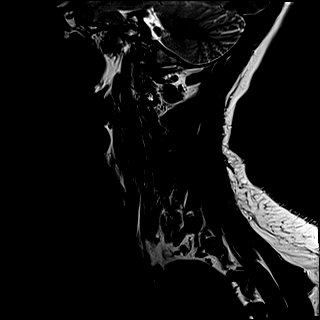

[Series 6: FLAIR · sagittal · 3.0mm · 0.78mm/px · 7 of 15 slices shown]
[im 1/15]
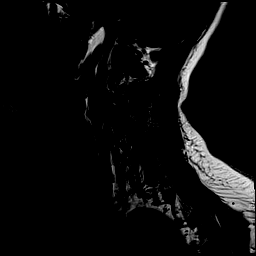
[im 3/15]
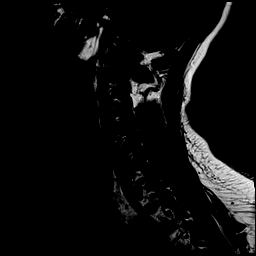
[im 5/15]
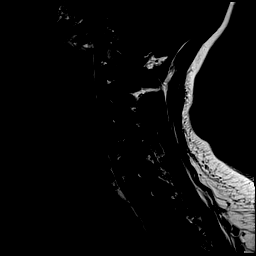
[im 8/15]
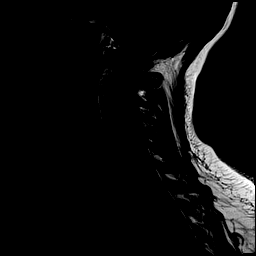
[im 10/15]
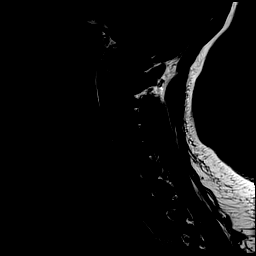
[im 12/15]
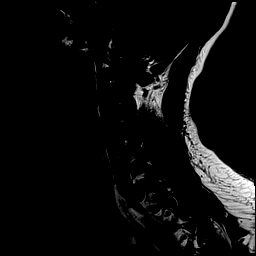
[im 15/15]
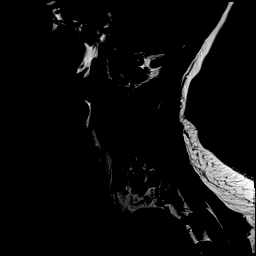

[Series 7: STIR · sagittal · 3.0mm · 0.62mm/px · 7 of 15 slices shown]
[im 1/15]
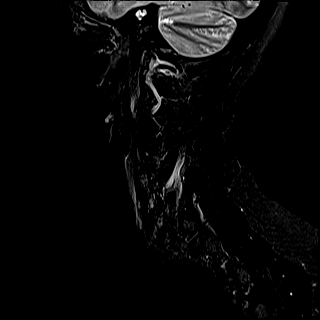
[im 3/15]
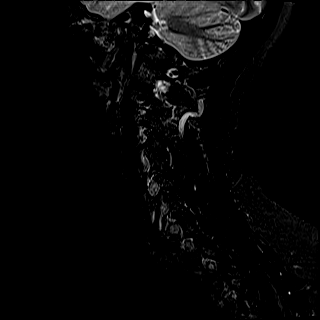
[im 5/15]
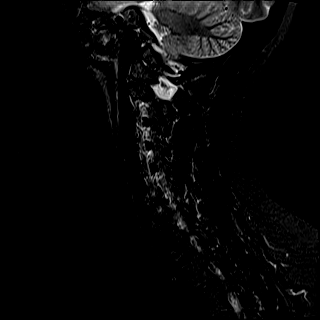
[im 8/15]
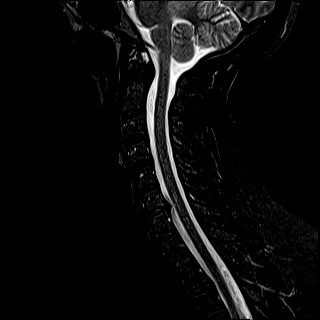
[im 10/15]
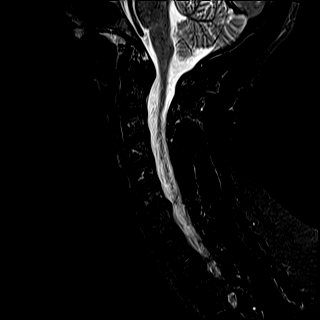
[im 12/15]
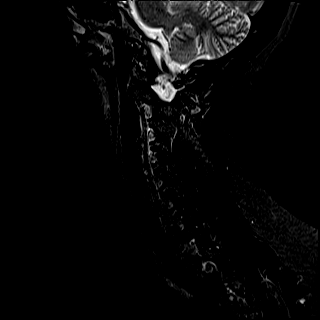
[im 15/15]
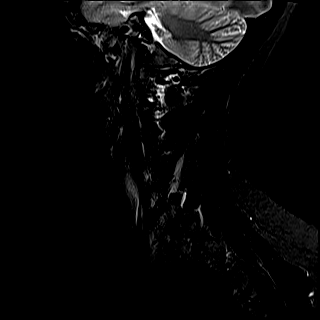

[Series 8: T2 · axial · 3.0mm · 0.70mm/px · z∈[-64,+30]mm · 10 of 29 slices shown (2 of 2)]
[im 1/29]
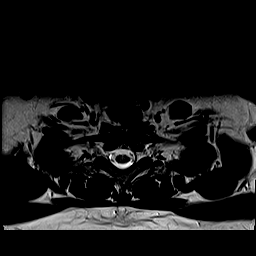
[im 3/29]
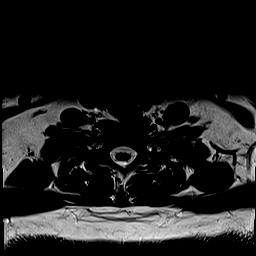
[im 5/29]
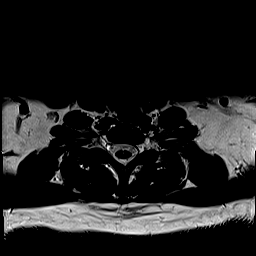
[im 7/29]
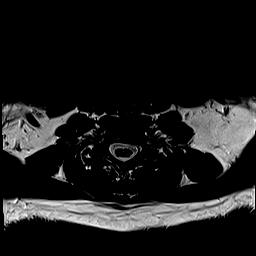
[im 9/29]
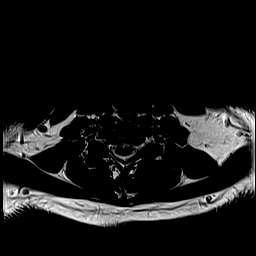
[im 13/29]
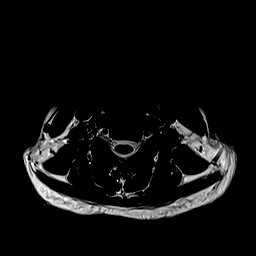
[im 16/29]
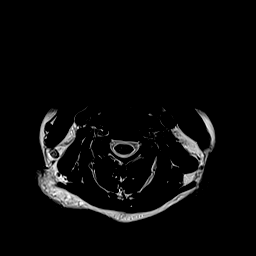
[im 20/29]
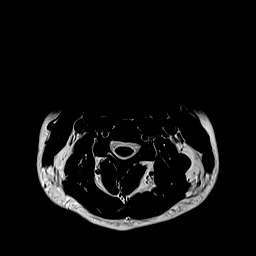
[im 24/29]
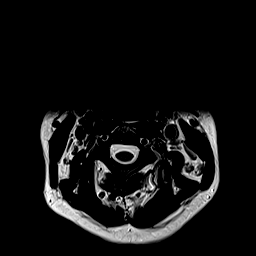
[im 29/29]
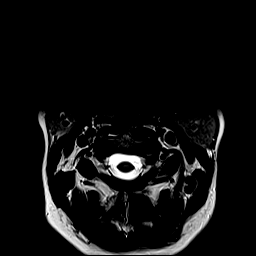

[Series 9: ax mpgr · axial · 3.0mm · 0.35mm/px · z∈[-64,+30]mm · 8 of 29 slices shown]
[im 1/29]
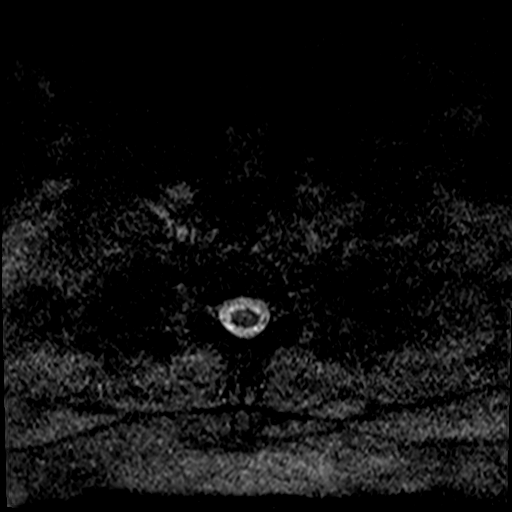
[im 5/29]
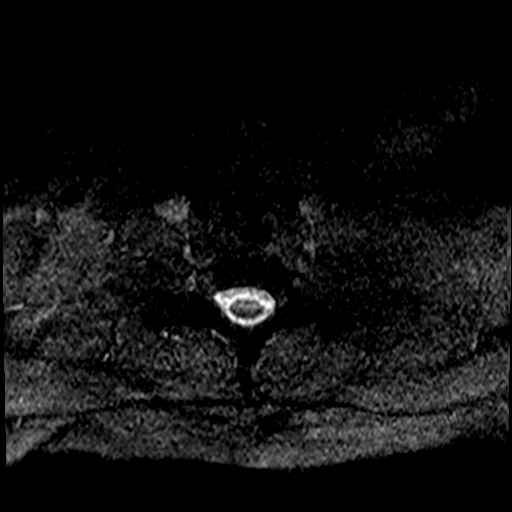
[im 9/29]
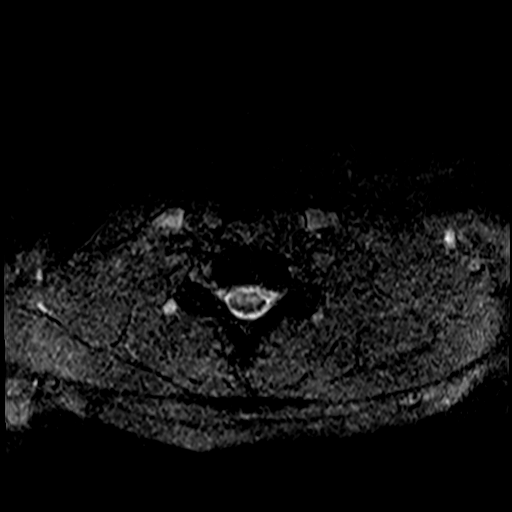
[im 13/29]
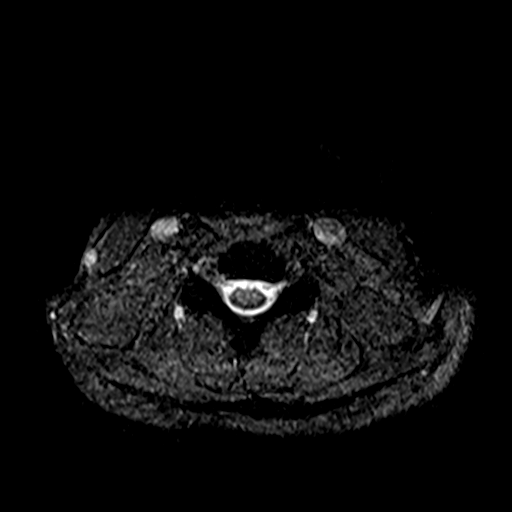
[im 16/29]
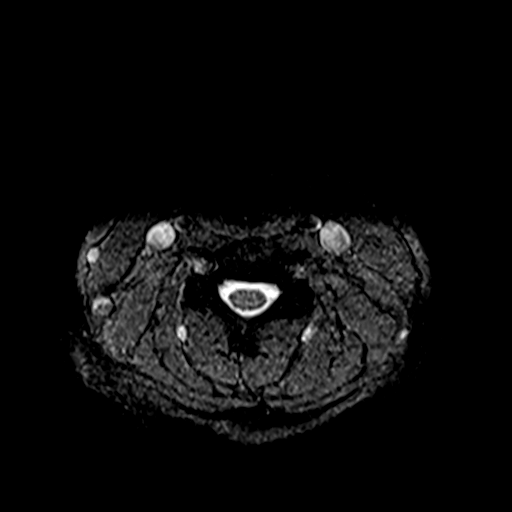
[im 20/29]
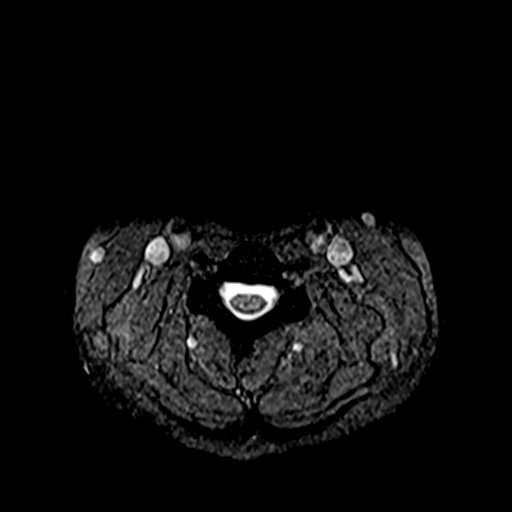
[im 24/29]
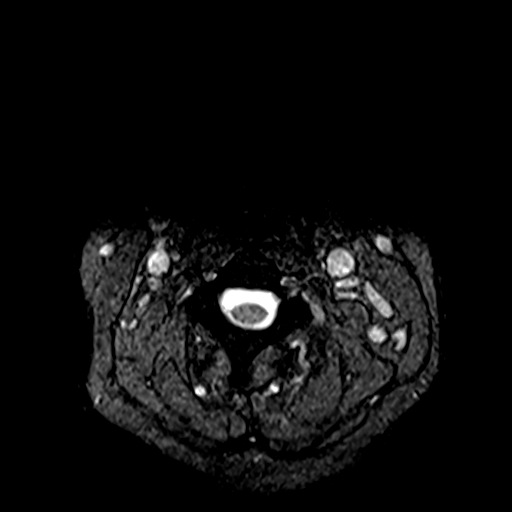
[im 29/29]
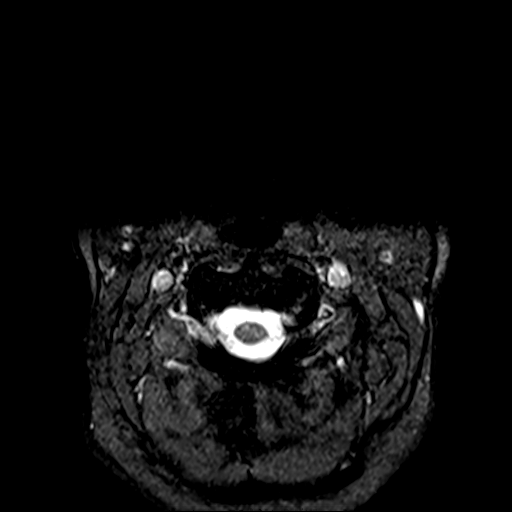

[38 of 48 positions shown; findings below may reference images not displayed]

FINDINGS: Alignment: Mild straightening of the normal cervical lordosis. No
listhesis or subluxation.

Vertebrae: Vertebral body heights well maintained without evidence
for acute or chronic fracture. Bone marrow signal intensity within
normal limits for age. No discrete or worrisome osseous lesions.
Mild reactive endplate changes present about the C6-7 interspace. No
other abnormal marrow edema.

Cord: Signal intensity within the cervical spinal cord is normal.
Normal cord caliber and morphology.

Posterior Fossa, vertebral arteries, paraspinal tissues: Visualized
brain and posterior fossa within normal limits. Craniocervical
junction normal. Paraspinous and prevertebral soft tissues within
normal limits. Normal intravascular flow voids seen within the
vertebral arteries bilaterally.

Disc levels:

C2-C3: Unremarkable.

C3-C4:  Unremarkable.

C4-C5:  Trace annular disc bulge.  No canal or foraminal stenosis.

C5-C6:  Unremarkable.

C6-C7: Diffuse degenerative disc bulge with intervertebral disc
space narrowing and mild reactive endplate changes. Disc bulging
most pronounced at the level of the neural foramina bilaterally,
right greater than left. Mild flattening of the ventral thecal sac
without significant stenosis or cord deformity. Mild to moderate
bilateral C7 foraminal stenosis, right greater than left.

C7-T1:  Unremarkable.

Visualized upper thoracic spine within normal limits.
IMPRESSION: 1. Disc bulging at C6-7 with resultant mild to moderate right
greater than left C7 foraminal stenosis. Query C7 radiculitis.
2. Otherwise unremarkable MRI of the cervical spine.

## 2020-12-13 ENCOUNTER — Other Ambulatory Visit: Payer: Self-pay

## 2020-12-13 ENCOUNTER — Other Ambulatory Visit
Admission: RE | Admit: 2020-12-13 | Discharge: 2020-12-13 | Disposition: A | Discharge status: Home / Self Care | Payer: Managed Care, Other (non HMO) | Source: Ambulatory Visit | Attending: Obstetrics and Gynecology | Admitting: Obstetrics and Gynecology

## 2020-12-13 DIAGNOSIS — Z01812 Encounter for preprocedural laboratory examination: Secondary | ICD-10-CM | POA: Insufficient documentation

## 2020-12-13 DIAGNOSIS — Z20822 Contact with and (suspected) exposure to covid-19: Secondary | ICD-10-CM | POA: Insufficient documentation

## 2020-12-13 LAB — SARS CORONAVIRUS 2 (TAT 6-24 HRS): SARS Coronavirus 2: NEGATIVE

## 2020-12-15 ENCOUNTER — Inpatient Hospital Stay
Admission: EM | Admit: 2020-12-15 | Discharge: 2020-12-17 | DRG: 807 | Disposition: A | Discharge status: Home / Self Care | Payer: Managed Care, Other (non HMO) | Attending: Obstetrics and Gynecology | Admitting: Obstetrics and Gynecology

## 2020-12-15 ENCOUNTER — Encounter: Payer: Self-pay | Admitting: Obstetrics and Gynecology

## 2020-12-15 ENCOUNTER — Other Ambulatory Visit: Payer: Self-pay

## 2020-12-15 ENCOUNTER — Inpatient Hospital Stay: Payer: Managed Care, Other (non HMO) | Admitting: Anesthesiology

## 2020-12-15 DIAGNOSIS — E669 Obesity, unspecified: Secondary | ICD-10-CM | POA: Diagnosis present

## 2020-12-15 DIAGNOSIS — Z20822 Contact with and (suspected) exposure to covid-19: Secondary | ICD-10-CM | POA: Diagnosis present

## 2020-12-15 DIAGNOSIS — O24424 Gestational diabetes mellitus in childbirth, insulin controlled: Principal | ICD-10-CM | POA: Diagnosis present

## 2020-12-15 DIAGNOSIS — Z3A39 39 weeks gestation of pregnancy: Secondary | ICD-10-CM | POA: Diagnosis not present

## 2020-12-15 DIAGNOSIS — O99214 Obesity complicating childbirth: Secondary | ICD-10-CM | POA: Diagnosis present

## 2020-12-15 DIAGNOSIS — O24419 Gestational diabetes mellitus in pregnancy, unspecified control: Secondary | ICD-10-CM | POA: Diagnosis present

## 2020-12-15 DIAGNOSIS — R7303 Prediabetes: Secondary | ICD-10-CM | POA: Diagnosis present

## 2020-12-15 LAB — GLUCOSE, CAPILLARY
Glucose-Capillary: 124 mg/dL — ABNORMAL HIGH (ref 70–99)
Glucose-Capillary: 90 mg/dL (ref 70–99)
Glucose-Capillary: 118 mg/dL — ABNORMAL HIGH (ref 70–99)
Glucose-Capillary: 90 mg/dL (ref 70–99)
Glucose-Capillary: 85 mg/dL (ref 70–99)
Glucose-Capillary: 90 mg/dL (ref 70–99)

## 2020-12-15 LAB — ABO/RH: ABO/RH(D): O POS

## 2020-12-15 LAB — CBC
HCT: 33.4 % — ABNORMAL LOW (ref 36.0–46.0)
Hemoglobin: 11 g/dL — ABNORMAL LOW (ref 12.0–15.0)
MCH: 29.9 pg (ref 26.0–34.0)
MCHC: 32.9 g/dL (ref 30.0–36.0)
MCV: 90.8 fL (ref 80.0–100.0)
Platelets: 178 10*3/uL (ref 150–400)
RBC: 3.68 MIL/uL — ABNORMAL LOW (ref 3.87–5.11)
RDW: 14.8 % (ref 11.5–15.5)
WBC: 6.4 10*3/uL (ref 4.0–10.5)
nRBC: 0 % (ref 0.0–0.2)

## 2020-12-15 LAB — TYPE AND SCREEN
ABO/RH(D): O POS
Antibody Screen: NEGATIVE

## 2020-12-15 MED ORDER — FENTANYL 2.5 MCG/ML W/ROPIVACAINE 0.15% IN NS 100 ML EPIDURAL (ARMC)
EPIDURAL | Status: AC
  Filled 2020-12-15: qty 100

## 2020-12-15 MED ORDER — BUPIVACAINE HCL (PF) 0.25 % IJ SOLN
INTRAMUSCULAR | Status: DC | PRN
  Administered 2020-12-15: 2 mL via EPIDURAL
  Administered 2020-12-15: 4 mL via EPIDURAL

## 2020-12-15 MED ORDER — OXYTOCIN 10 UNIT/ML IJ SOLN
INTRAMUSCULAR | Status: AC
  Filled 2020-12-15: qty 2

## 2020-12-15 MED ORDER — LACTATED RINGERS IV SOLN: INTRAVENOUS | Status: DC

## 2020-12-15 MED ORDER — FENTANYL 2.5 MCG/ML W/ROPIVACAINE 0.15% IN NS 100 ML EPIDURAL (ARMC)
12.0000 mL/h | EPIDURAL | Status: DC
Start: 2020-12-15 — End: 2020-12-16
  Administered 2020-12-15: 12 mL/h via EPIDURAL
  Filled 2020-12-15: qty 100

## 2020-12-15 MED ORDER — LIDOCAINE HCL (PF) 1 % IJ SOLN
30.0000 mL | INTRAMUSCULAR | Status: AC | PRN
  Administered 2020-12-16: 30 mL via SUBCUTANEOUS

## 2020-12-15 MED ORDER — PHENYLEPHRINE 40 MCG/ML (10ML) SYRINGE FOR IV PUSH (FOR BLOOD PRESSURE SUPPORT): 80.0000 ug | PREFILLED_SYRINGE | INTRAVENOUS | Status: DC | PRN

## 2020-12-15 MED ORDER — BUTORPHANOL TARTRATE 1 MG/ML IJ SOLN
1.0000 mg | INTRAMUSCULAR | Status: DC | PRN
  Administered 2020-12-15 (×3): 1 mg via INTRAVENOUS
  Filled 2020-12-15 (×3): qty 1

## 2020-12-15 MED ORDER — LIDOCAINE HCL (PF) 1 % IJ SOLN
INTRAMUSCULAR | Status: AC
  Filled 2020-12-15: qty 30

## 2020-12-15 MED ORDER — LEVOTHYROXINE SODIUM 50 MCG PO TABS
50.0000 ug | ORAL_TABLET | Freq: Every day | ORAL | Status: DC
  Administered 2020-12-15 – 2020-12-17 (×3): 50 ug via ORAL
  Filled 2020-12-15 (×4): qty 1

## 2020-12-15 MED ORDER — LACTATED RINGERS IV SOLN
500.0000 mL | INTRAVENOUS | Status: DC | PRN
  Administered 2020-12-15: 500 mL via INTRAVENOUS

## 2020-12-15 MED ORDER — SOD CITRATE-CITRIC ACID 500-334 MG/5ML PO SOLN
ORAL | Status: AC
  Filled 2020-12-15: qty 15

## 2020-12-15 MED ORDER — OXYTOCIN-SODIUM CHLORIDE 30-0.9 UT/500ML-% IV SOLN
1.0000 m[IU]/min | INTRAVENOUS | Status: DC
  Administered 2020-12-15: 2 m[IU]/min via INTRAVENOUS

## 2020-12-15 MED ORDER — MISOPROSTOL 25 MCG QUARTER TABLET
25.0000 ug | ORAL_TABLET | ORAL | Status: DC | PRN
  Administered 2020-12-15: 25 ug via VAGINAL
  Filled 2020-12-15 (×2): qty 1

## 2020-12-15 MED ORDER — DIPHENHYDRAMINE HCL 50 MG/ML IJ SOLN: 12.5000 mg | INTRAMUSCULAR | Status: DC | PRN

## 2020-12-15 MED ORDER — OXYTOCIN-SODIUM CHLORIDE 30-0.9 UT/500ML-% IV SOLN
2.5000 [IU]/h | INTRAVENOUS | Status: DC
  Filled 2020-12-15 (×2): qty 500

## 2020-12-15 MED ORDER — LIDOCAINE-EPINEPHRINE (PF) 1.5 %-1:200000 IJ SOLN
INTRAMUSCULAR | Status: DC | PRN
  Administered 2020-12-15: 3 mL via EPIDURAL

## 2020-12-15 MED ORDER — OXYTOCIN BOLUS FROM INFUSION
333.0000 mL | Freq: Once | INTRAVENOUS | Status: AC
  Administered 2020-12-16: 333 mL via INTRAVENOUS

## 2020-12-15 MED ORDER — OXYCODONE-ACETAMINOPHEN 5-325 MG PO TABS
2.0000 | ORAL_TABLET | ORAL | Status: DC | PRN
Start: 2020-12-15 — End: 2020-12-16

## 2020-12-15 MED ORDER — TERBUTALINE SULFATE 1 MG/ML IJ SOLN: 0.2500 mg | Freq: Once | INTRAMUSCULAR | Status: DC | PRN

## 2020-12-15 MED ORDER — SOD CITRATE-CITRIC ACID 500-334 MG/5ML PO SOLN: 30.0000 mL | ORAL | Status: DC | PRN

## 2020-12-15 MED ORDER — LACTATED RINGERS IV SOLN: 500.0000 mL | Freq: Once | INTRAVENOUS | Status: DC

## 2020-12-15 MED ORDER — ONDANSETRON HCL 4 MG/2ML IJ SOLN
4.0000 mg | Freq: Four times a day (QID) | INTRAMUSCULAR | Status: DC | PRN
Start: 2020-12-15 — End: 2020-12-16
  Administered 2020-12-15: 4 mg via INTRAVENOUS
  Filled 2020-12-15: qty 2

## 2020-12-15 MED ORDER — LIDOCAINE HCL (PF) 1 % IJ SOLN
INTRAMUSCULAR | Status: DC | PRN
  Administered 2020-12-15: 3 mL via SUBCUTANEOUS

## 2020-12-15 MED ORDER — EPHEDRINE 5 MG/ML INJ
INTRAVENOUS | Status: AC
  Filled 2020-12-15: qty 4

## 2020-12-15 MED ORDER — EPHEDRINE 5 MG/ML INJ: 10.0000 mg | INTRAVENOUS | Status: DC | PRN

## 2020-12-15 MED ORDER — EPHEDRINE 5 MG/ML INJ
10.0000 mg | INTRAVENOUS | Status: AC | PRN
  Administered 2020-12-15: 5 mg via INTRAVENOUS
  Administered 2020-12-15: 10 mg via INTRAVENOUS

## 2020-12-15 MED ORDER — ACETAMINOPHEN 325 MG PO TABS: 650.0000 mg | ORAL_TABLET | ORAL | Status: DC | PRN

## 2020-12-15 MED ORDER — MISOPROSTOL 25 MCG QUARTER TABLET
25.0000 ug | ORAL_TABLET | Freq: Once | ORAL | Status: AC
  Administered 2020-12-15: 25 ug via ORAL
  Filled 2020-12-15: qty 1

## 2020-12-15 MED ORDER — MISOPROSTOL 200 MCG PO TABS
ORAL_TABLET | ORAL | Status: AC
  Filled 2020-12-15: qty 4

## 2020-12-15 MED ORDER — AMMONIA AROMATIC IN INHA
RESPIRATORY_TRACT | Status: AC
  Filled 2020-12-15: qty 10

## 2020-12-15 MED ORDER — OXYCODONE-ACETAMINOPHEN 5-325 MG PO TABS
1.0000 | ORAL_TABLET | ORAL | Status: DC | PRN
Start: 2020-12-15 — End: 2020-12-16

## 2020-12-15 NOTE — Progress Notes (Signed)
Ann Cooper is a 35 y.o. G1P0 at [redacted]w[redacted]d by  Subjective: IOL for A2 insulin GDM . Glucose values good  S/p 2 doses cytotec   Objective: BP 124/71 (BP Location: Left Arm)   Pulse 78   Temp 98.3 F (36.8 C) (Oral)   Resp 15   Ht 5\' 4"  (1.626 m)   Wt 99.8 kg   LMP 03/17/2020 (Exact Date)   BMI 37.76 kg/m  I/O last 3 completed shifts: In: 1250 [P.O.:500; I.V.:750] Out: -  No intake/output data recorded.  FHT:  FHR: 130 bpm, variability: moderate,  accelerations:  Present,  decelerations:  Absent UC:   regular, every 3 minutes SVE:   Dilation: 1.5 Effacement (%): 70 Station: -1 Exam by:: [email or web address] RN arom by me . Clear :  Labs: Lab Results  Component Value Date   WBC 6.4 12/15/2020   HGB 11.0 (L) 12/15/2020   HCT 33.4 (L) 12/15/2020   MCV 90.8 12/15/2020   PLT 178 12/15/2020    Assessment / Plan: A2gdm normal glucose levels  Labor:will start Pitocin   CLE  Vs stadol prn    12/17/2020 Shayley Medlin 12/15/2020, 9:30 AM

## 2020-12-15 NOTE — Progress Notes (Signed)
Wireless EFM applied so patient could ambulate in room. Pt rating pain 4/10 scale now but states she is managing the pain currently.

## 2020-12-15 NOTE — Progress Notes (Signed)
Patient ID: Ann Cooper, female   DOB: 08-08-1986, 35 y.o.   MRN: [id number] .ts

## 2020-12-15 NOTE — Progress Notes (Signed)
Patient ID: Ann Cooper, female   DOB: [birth year], 35 y.o.   MRN: [id number] Additional hypotension required 10 mg ephedrine .  bp stable now   CX 4-5 cm 90 %/ +1 station  Reassuring fetal monitoring

## 2020-12-15 NOTE — Progress Notes (Signed)
Patient ID: Ann Cooper, female   DOB: [birth year], 35 y.o.   MRN: [id number] Post CLE patient developed Hypotension requiring ephedrine directed by Anesthesiology  Fetal bradycardia responded to turning off Pitocin position changes and IVF  IUPC placed and 2 x fse neither working so reverted to efm again . Most recent BP 115/60 Currently early decels . + accels - reassuring fetal monitoring  Continue close observation

## 2020-12-15 NOTE — Progress Notes (Signed)
RN at bedside. Pt resting comfortably and rating contraction pain 2/10 scale and states they feel like strong period cramps. Pt is contracting every 1.5-5 minutes. Pt denies any needs at this time and was updated on her plan of care. Pt states she takes Synthroid 50 mcg daily so RN obtained a verbal order from CNM.

## 2020-12-15 NOTE — Anesthesia Preprocedure Evaluation (Signed)
Anesthesia Evaluation  Patient identified by MRN, date of birth, ID band Patient awake    Reviewed: Allergy & Precautions, H&P , NPO status , Patient's Chart, lab work & pertinent test results  Airway Mallampati: II  TM Distance: >3 FB Neck ROM: full    Dental  (+) Teeth Intact   Pulmonary           Cardiovascular      Neuro/Psych PSYCHIATRIC DISORDERS Depression negative neurological ROS     GI/Hepatic Neg liver ROS, GERD  Medicated and Controlled,  Endo/Other  diabetes, Gestational, Insulin Dependent  Renal/GU   negative genitourinary   Musculoskeletal   Abdominal   Peds  Hematology negative hematology ROS (+)   Anesthesia Other Findings   Reproductive/Obstetrics (+) Pregnancy                             Anesthesia Physical Anesthesia Plan  ASA: II  Anesthesia Plan: Epidural   Post-op Pain Management:    Induction:   PONV Risk Score and Plan:   Airway Management Planned:   Additional Equipment:   Intra-op Plan:   Post-operative Plan:   Informed Consent: I have reviewed the patients History and Physical, chart, labs and discussed the procedure including the risks, benefits and alternatives for the proposed anesthesia with the patient or authorized representative who has indicated his/her understanding and acceptance.       Plan Discussed with: Anesthesiologist and CRNA  Anesthesia Plan Comments:         Anesthesia Quick Evaluation

## 2020-12-15 NOTE — H&P (Signed)
OB History & Physical   History of Present Illness:  Chief Complaint:   HPI:  Ann Cooper is a 35 y.o. G1P0 female at [redacted]w[redacted]d dated by LMP of 03/17/20.  She presents to L&D for IOL for A2GDM.  She reports:  -active fetal movement -no leakage of fluid -no vaginal bleeding -no contractions  Pregnancy Issues: 1. Advanced maternal age 35. History of thyroid disease 3. Gestational Diabetes  11/15/20 PM NPH increased to 12 units  AM: NPH 16 units/Reg 10 units  PM: NPH 12 units/Reg 6 units  4. Prepregnancy BMI 34    Maternal Medical History:   Past Medical History:  Diagnosis Date  . Allergy   . Depression   . Gestational diabetes   . History of chicken pox   . History of hypothyroidism   . Thyroid disease     Past Surgical History:  Procedure Laterality Date  . NPFL left knee  02/2011   and menscus repair    Allergies  Allergen Reactions  . Red Dye Nausea And Vomiting  . Tree Extract     Tree nuts- ear and throat gets itchy.     Prior to Admission medications   Medication Sig Start Date End Date Taking? Authorizing Provider  fluticasone (FLONASE) 50 MCG/ACT nasal spray Place 1 spray into the nose daily as needed.  01/15/18  Yes [provider]  insulin NPH Human (NOVOLIN N) 100 UNIT/ML injection Inject 16 units in AM and 6 units in PM 10/27/20  Yes [provider]  insulin regular (NOVOLIN R) 100 units/mL injection Inject 10 units in AM and 6 units in PM 10/27/20  Yes [provider]  Insulin Syringe-Needle U-100 (INSULIN SYRINGE 1CC/31GX5/16") 31G X 5/16" 1 ML MISC Use as directed Use to inject insulin 10/27/20 10/27/21 Yes [provider]  levothyroxine (SYNTHROID) 50 MCG tablet Take 50 mcg by mouth daily. 08/24/20 08/24/21 Yes [provider]  Prenatal Vit-Fe Fumarate-FA (PRENATAL FA PO) Take by mouth daily.   Yes [provider]  diphenhydrAMINE (SOMINEX) 25 MG tablet Take 25 mg by mouth at bedtime as needed for  sleep. Patient not taking: Reported on 12/15/2020    [provider]  metFORMIN (GLUCOPHAGE-XR) 500 MG 24 hr tablet Take 1,000 mg by mouth daily. Until insulin started Patient not taking: Reported on 12/15/2020 10/07/20 10/07/21  [provider]     Prenatal care site: [facility]   Social History: She  reports that she has never smoked. She has never used smokeless tobacco. She reports previous alcohol use. She reports that she does not use drugs.  Family History: family history includes Alcohol abuse in her maternal grandfather; Arthritis in her maternal grandmother and mother; Asthma in her maternal grandmother, mother, and sister; Depression in her maternal grandmother, mother, and sister; Diabetes in her maternal grandfather; Miscarriages / [place] in her mother; Scoliosis in her maternal grandmother.   Review of Systems: A full review of systems was performed and negative except as noted in the HPI.    Physical Exam:  Vital Signs: BP 126/74 (BP Location: Left Arm)   Pulse 74   Temp 98.4 F (36.9 C) (Oral)   Resp 16   Ht 5\' 4"  (1.626 m)   Wt 99.8 kg   LMP 03/17/2020 (Exact Date)   BMI 37.76 kg/m   General:   alert and cooperative  Skin:  normal  Neurologic:    Alert & oriented x 3  Lungs:   nl effort  Heart:   regular rate and rhythm  Abdomen:  soft, non-tender; bowel sounds normal; no masses,  no organomegaly  Extremities: : non-tender, symmetric    EFW: 12/02/20: (3201g) = 60%  Results for orders placed or performed during the hospital encounter of 12/15/20 (from the past 24 hour(s))  CBC     Status: Abnormal   Collection Time: 12/15/20  1:04 AM  Result Value Ref Range   WBC 6.4 4.0 - 10.5 K/uL   RBC 3.68 (L) 3.87 - 5.11 MIL/uL   Hemoglobin 11.0 (L) 12.0 - 15.0 g/dL   HCT 53.6 (L) 14.4 - 31.5 %   MCV 90.8 80.0 - 100.0 fL   MCH 29.9 26.0 - 34.0 pg   MCHC 32.9 30.0 - 36.0 g/dL   RDW 40.0 86.7 - 61.9 %   Platelets 178 150 - 400 K/uL    nRBC 0.0 0.0 - 0.2 %  Type and screen     Status: None   Collection Time: 12/15/20  1:04 AM  Result Value Ref Range   ABO/RH(D) O POS    Antibody Screen NEG    Sample Expiration      12/18/2020,2359 Performed at [facility], [address] [place], [place] [address]   Glucose, capillary     Status: Abnormal   Collection Time: 12/15/20  1:31 AM  Result Value Ref Range   Glucose-Capillary 124 (H) 70 - 99 mg/dL  ABO/Rh     Status: None   Collection Time: 12/15/20  2:21 AM  Result Value Ref Range   ABO/RH(D)      O POS Performed at [facility], [address] [place], [place] [address]   Glucose, capillary     Status: None   Collection Time: 12/15/20  5:29 AM  Result Value Ref Range   Glucose-Capillary 90 70 - 99 mg/dL    Pertinent Results:  Prenatal Labs: Blood type/Rh O pos  Antibody screen neg  Rubella Immune  Varicella Immune  RPR NR  HBsAg Neg  HIV NR  GC neg  Chlamydia neg  Genetic screening negative  1 hour GTT 217  3 hour GTT   GBS Neg   FHT: FHR: 140 bpm, variability: moderate,  accelerations:  Present,  decelerations:  Absent Category/reactivity:  Category I TOCO: none SVE: Dilation: 2 / Effacement (%): 60 / Station: -3     Assessment:  Ann Cooper is a 35 y.o. G1P0 female at [redacted]w[redacted]d with A2GDM.   Plan:  1. Admit to Labor & Delivery; consents reviewed and obtained  2. Fetal Well being  - Fetal Tracing: Cat I - GBS neg - Presentation: VTX confirmed by SVE   3. Routine OB: - Prenatal labs reviewed, as above - Rh pos - CBC & T&S on admit - Clear fluids, IVF  4. Induction of Labor -  Contractions external toco in place -  Plan for induction with Cytotec and Pitocin -  Plan for continuous fetal monitoring  -  Maternal pain control as desired: IVPM, nitrous, regional anesthesia - Anticipate vaginal delivery  5. Post Partum Planning: - Infant feeding: Breastfeeding - Contraception: TBD - Flu: 07/29/20 - Tdap:  10/07/20   Haroldine Laws, CNM 12/15/2020 6:39 AM

## 2020-12-15 NOTE — Anesthesia Procedure Notes (Signed)
Epidural Patient location during procedure: OB Start time: 12/15/2020 4:11 PM End time: 12/15/2020 4:20 PM  Staffing Anesthesiologist: Corinda Gubler, MD Resident/CRNA: Karoline Caldwell, CRNA Performed: resident/CRNA   Preanesthetic Checklist Completed: patient identified, IV checked, site marked, risks and benefits discussed, surgical consent, monitors and equipment checked, pre-op evaluation and timeout performed  Epidural Patient position: sitting Prep: ChloraPrep Patient monitoring: heart rate, continuous pulse ox and blood pressure Approach: midline Location: L3-L4 Injection technique: LOR air  Needle:  Needle type: Tuohy  Needle gauge: 17 G Needle length: 9 cm and 9 Needle insertion depth: 6 cm Catheter type: closed end flexible Catheter size: 19 Gauge Catheter at skin depth: 11 cm Test dose: negative and 1.5% lidocaine with Epi 1:200 K  Assessment Events: blood not aspirated, injection not painful, no injection resistance, no paresthesia and negative IV test  Additional Notes 1 attempt Pt. Evaluated and documentation done after procedure finished. Patient identified. Risks/Benefits/Options discussed with patient including but not limited to bleeding, infection, nerve damage, paralysis, failed block, incomplete pain control, headache, blood pressure changes, nausea, vomiting, reactions to medication both or allergic, itching and postpartum back pain. Confirmed with bedside nurse the patient's most recent platelet count. Confirmed with patient that they are not currently taking any anticoagulation, have any bleeding history or any family history of bleeding disorders. Patient expressed understanding and wished to proceed. All questions were answered. Sterile technique was used throughout the entire procedure. Please see nursing notes for vital signs. Test dose was given through epidural catheter and negative prior to continuing to dose epidural or start infusion. Warning signs of  high block given to the patient including shortness of breath, tingling/numbness in hands, complete motor block, or any concerning symptoms with instructions to call for help. Patient was given instructions on fall risk and not to get out of bed. All questions and concerns addressed with instructions to call with any issues or inadequate analgesia.   Patient tolerated the insertion well without immediate complications.Reason for block:procedure for pain

## 2020-12-16 ENCOUNTER — Encounter: Payer: Self-pay | Admitting: Obstetrics and Gynecology

## 2020-12-16 LAB — CBC
HCT: 32.8 % — ABNORMAL LOW (ref 36.0–46.0)
Hemoglobin: 11 g/dL — ABNORMAL LOW (ref 12.0–15.0)
MCH: 30.1 pg (ref 26.0–34.0)
MCHC: 33.5 g/dL (ref 30.0–36.0)
MCV: 89.6 fL (ref 80.0–100.0)
Platelets: 154 10*3/uL (ref 150–400)
RBC: 3.66 MIL/uL — ABNORMAL LOW (ref 3.87–5.11)
RDW: 14.6 % (ref 11.5–15.5)
WBC: 10.9 10*3/uL — ABNORMAL HIGH (ref 4.0–10.5)
nRBC: 0 % (ref 0.0–0.2)

## 2020-12-16 LAB — RPR: RPR Ser Ql: NONREACTIVE

## 2020-12-16 MED ORDER — ACETAMINOPHEN 325 MG PO TABS
650.0000 mg | ORAL_TABLET | ORAL | Status: DC | PRN
  Administered 2020-12-16 – 2020-12-17 (×7): 650 mg via ORAL
  Filled 2020-12-16 (×7): qty 2

## 2020-12-16 MED ORDER — MEASLES, MUMPS & RUBELLA VAC IJ SOLR
0.5000 mL | Freq: Once | INTRAMUSCULAR | Status: DC
  Filled 2020-12-16: qty 0.5

## 2020-12-16 MED ORDER — DIPHENHYDRAMINE HCL 25 MG PO CAPS: 25.0000 mg | ORAL_CAPSULE | Freq: Four times a day (QID) | ORAL | Status: DC | PRN

## 2020-12-16 MED ORDER — SENNOSIDES-DOCUSATE SODIUM 8.6-50 MG PO TABS
2.0000 | ORAL_TABLET | ORAL | Status: DC
  Administered 2020-12-17: 2 via ORAL
  Filled 2020-12-16: qty 2

## 2020-12-16 MED ORDER — ZOLPIDEM TARTRATE 5 MG PO TABS
5.0000 mg | ORAL_TABLET | Freq: Every evening | ORAL | Status: DC | PRN
Start: 2020-12-16 — End: 2020-12-18

## 2020-12-16 MED ORDER — HYDROCODONE-ACETAMINOPHEN 5-325 MG PO TABS: 1.0000 | ORAL_TABLET | ORAL | Status: DC | PRN

## 2020-12-16 MED ORDER — IBUPROFEN 600 MG PO TABS
600.0000 mg | ORAL_TABLET | Freq: Four times a day (QID) | ORAL | Status: DC
  Administered 2020-12-16 – 2020-12-17 (×7): 600 mg via ORAL
  Filled 2020-12-16 (×7): qty 1

## 2020-12-16 MED ORDER — ONDANSETRON HCL 4 MG PO TABS
4.0000 mg | ORAL_TABLET | ORAL | Status: DC | PRN
  Filled 2020-12-16: qty 1

## 2020-12-16 MED ORDER — FERROUS SULFATE 325 (65 FE) MG PO TABS
325.0000 mg | ORAL_TABLET | Freq: Two times a day (BID) | ORAL | Status: DC
  Administered 2020-12-16 – 2020-12-17 (×4): 325 mg via ORAL
  Filled 2020-12-16 (×4): qty 1

## 2020-12-16 MED ORDER — MAGNESIUM HYDROXIDE 400 MG/5ML PO SUSP
30.0000 mL | ORAL | Status: DC | PRN
  Filled 2020-12-16: qty 30

## 2020-12-16 MED ORDER — ONDANSETRON HCL 4 MG/2ML IJ SOLN: 4.0000 mg | INTRAMUSCULAR | Status: DC | PRN

## 2020-12-16 MED ORDER — COCONUT OIL OIL
1.0000 "application " | TOPICAL_OIL | Status: DC | PRN
  Administered 2020-12-16: 1 via TOPICAL
  Filled 2020-12-16: qty 120

## 2020-12-16 MED ORDER — BENZOCAINE-MENTHOL 20-0.5 % EX AERO
1.0000 "application " | INHALATION_SPRAY | CUTANEOUS | Status: DC | PRN
  Administered 2020-12-16: 1 via TOPICAL
  Filled 2020-12-16 (×2): qty 56

## 2020-12-16 MED ORDER — DIBUCAINE (PERIANAL) 1 % EX OINT
1.0000 "application " | TOPICAL_OINTMENT | CUTANEOUS | Status: DC | PRN
  Administered 2020-12-16: 1 via RECTAL
  Filled 2020-12-16: qty 28

## 2020-12-16 MED ORDER — SENNOSIDES-DOCUSATE SODIUM 8.6-50 MG PO TABS: 2.0000 | ORAL_TABLET | ORAL | Status: DC

## 2020-12-16 MED ORDER — PRENATAL MULTIVITAMIN CH
1.0000 | ORAL_TABLET | Freq: Every day | ORAL | Status: DC
  Administered 2020-12-16 – 2020-12-17 (×2): 1 via ORAL
  Filled 2020-12-16 (×2): qty 1

## 2020-12-16 MED ORDER — SIMETHICONE 80 MG PO CHEW: 80.0000 mg | CHEWABLE_TABLET | ORAL | Status: DC | PRN

## 2020-12-16 MED ORDER — WITCH HAZEL-GLYCERIN EX PADS
1.0000 "application " | MEDICATED_PAD | CUTANEOUS | Status: DC | PRN
  Administered 2020-12-16: 1 via TOPICAL
  Filled 2020-12-16 (×2): qty 100

## 2020-12-16 NOTE — Progress Notes (Signed)
Post Partum Day 0  Subjective: no complaints, up ad lib, voiding and tolerating PO  Doing well, no concerns. Ambulating without difficulty, pain managed with PO meds, tolerating regular diet, and voiding without difficulty.   No fever/chills, chest pain, shortness of breath, nausea/vomiting, or leg pain. No nipple or breast pain. No headache, visual changes, or RUQ/epigastric pain.  Objective: BP (!) 106/51 (BP Location: Left Arm)   Pulse 75   Temp 98.2 F (36.8 C) (Oral)   Resp 16   Ht 5\' 4"  (1.626 m)   Wt 99.8 kg   LMP 03/17/2020 (Exact Date)   SpO2 98%   Breastfeeding Unknown   BMI 37.76 kg/m    Physical Exam:  General: alert, cooperative and no distress Breasts: soft/nontender CV: RRR Pulm: nl effort, CTABL Abdomen: soft, non-tender, active bowel sounds Uterine Fundus: soft Perineum: minimal edema, repair well approximated Lochia: appropriate DVT Evaluation: No evidence of DVT seen on physical exam.  Recent Labs    12/15/20 0104 12/16/20 0630  HGB 11.0* 11.0*  HCT 33.4* 32.8*  WBC 6.4 10.9*  PLT 178 154    Assessment/Plan: 35 y.o. G1P1001 postpartum day # 0  -Continue routine postpartum care -Lactation consult PRN for breastfeeding  -Discussed contraceptive options including implant, IUDs hormonal and non-hormonal, injection, pills/ring/patch, condoms, and NFP. Desires Nexplanon.  -Hemodynamically stable   Disposition: Continue inpatient postpartum care    LOS: 1 day   12/18/20, CNM 12/16/2020, 8:45 AM   ----- 12/18/2020  Certified Nurse Midwife [facility]

## 2020-12-16 NOTE — Lactation Note (Addendum)
This note was copied from a baby's chart. Lactation Consultation Note  Patient Name: Ann Cooper [id number] Date: 12/16/2020 Reason for consult: Initial assessment;Primapara;Term Age:37 hours  Maternal Data Has patient been taught Hand Expression?: Yes Does the patient have breastfeeding experience prior to this delivery?: No  Feeding Mother's Current Feeding Choice: Breast Milk  LATCH Score Latch: Grasps breast easily, tongue down, lips flanged, rhythmical sucking. (with  nipple shield)  Audible Swallowing: Spontaneous and intermittent  Type of Nipple: Inverted  Comfort (Breast/Nipple): Soft / non-tender  Hold (Positioning): Assistance needed to correctly position infant at breast and maintain latch.  LATCH Score: 7   Lactation Tools Discussed/Used Tools: Nipple Shields Nipple shield size: 20 LC name and no written on white board Interventions Interventions: Breast feeding basics reviewed;Assisted with latch;Skin to skin;Breast massage;Hand express;Support pillows;Education  Discharge Discharge Education: Engorgement and breast care Pump: Personal WIC Program: No Breastfeeding resources and breastfeeding support group handouts given Consult Status Consult Status: Follow-up Date: 12/16/20 Follow-up type: In-patient    Ann Cooper 12/16/2020, 10:33 AM

## 2020-12-16 NOTE — Lactation Note (Signed)
This note was copied from a baby's chart. Lactation Consultation Note  Patient Name: Ann Cooper [id number] Date: 12/16/2020 Reason for consult: Follow-up assessment;Primapara Age:37 hours  Maternal Data  Mom's nipples pink, sl tender, DEBP set up with instruction in pumping to evert nipples before applying nipple shield to ensure that nipple is deep into shield so that baby does not latch to the tip of nipple only.  Do this before each feed, baby sleepy now so latch not attempted.  Feeding Mother's Current Feeding Choice: Breast Milk  LATCH Score                    Lactation Tools Discussed/Used Tools: Pump Nipple shield size: 20 Breast pump type: Double-Electric Breast Pump Pump Education: Setup, frequency, and cleaning;Milk Storage Reason for Pumping: to evert nipples before placing nipple shield Pumping frequency: before placing nipple shield  Interventions    Discharge    Consult Status Consult Status: Follow-up Date: 12/17/20 Follow-up type: In-patient    Dyann Kief 12/16/2020, 5:14 PM

## 2020-12-16 NOTE — Anesthesia Postprocedure Evaluation (Signed)
Anesthesia Post Note  Patient: Ann Cooper  Procedure(s) Performed: AN AD HOC LABOR EPIDURAL  Patient location during evaluation: Mother Baby Anesthesia Type: Epidural Level of consciousness: awake, awake and alert and oriented Pain management: pain level controlled Vital Signs Assessment: post-procedure vital signs reviewed and stable Respiratory status: spontaneous breathing, respiratory function stable and nonlabored ventilation Cardiovascular status: blood pressure returned to baseline and stable Postop Assessment: no headache and no backache Anesthetic complications: no   No complications documented.   Last Vitals:  Vitals:   12/16/20 0538 12/16/20 0739  BP: 118/67 (!) 106/51  Pulse: (!) 102 75  Resp:    Temp:  36.8 C  SpO2:  98%    Last Pain:  Vitals:   12/16/20 0820  TempSrc:   PainSc: 2                  Ginger Carne

## 2020-12-16 NOTE — Discharge Summary (Signed)
Obstetrical Discharge Summary  Patient Name: Ann Cooper DOB: 11-18-1985 MRN: [id number]  Date of Admission: 12/15/2020 Date of Delivery: 12/16/20 Delivered by: Beverly Gust MD Date of Discharge: 12/17/2020 Primary OB: [facility] GEZ:[id number] last menstrual period was 03/17/2020 (exact date). EDC Estimated Date of Delivery: 12/22/20 Gestational Age at Delivery: [redacted]w[redacted]d   Antepartum complications:A2 insulin GDM  Admitting Diagnosis: IOL Secondary Diagnosis: Patient Active Problem List   Diagnosis Date Noted  . Gestational diabetes 12/15/2020  . Menstrual cramp 11/26/2019  . Cervical radiculopathy 10/29/2018  . Obesity (BMI 30.0-34.9) 08/01/2016    Augmentation: AROM, Pitocin and Cytotec Complications: None Intrapartum complications/course:  Date of Delivery: 12/16/2020 Delivered By: Beverly Gust MD Delivery Type: spontaneous vaginal delivery Anesthesia: epidural Placenta: spontaneous Laceration: 2nd degree perineal with repair  Episiotomy: none Newborn Data: Live born female  Birth Weight:  7lbs 2.6oz, 3250g APGAR: 8, 9  Newborn Delivery   Birth date/time: 12/16/2020 02:20:00 Delivery type: Vaginal, Spontaneous      Postpartum Procedures: none   Edinburgh:  Edinburgh Postnatal Depression Scale Screening Tool 12/16/2020 12/16/2020  I have been able to laugh and see the funny side of things. 0 (No Data)  I have looked forward with enjoyment to things. 0 -  I have blamed myself unnecessarily when things went wrong. 1 -  I have been anxious or worried for no good reason. 0 -  I have felt scared or panicky for no good reason. 0 -  Things have been getting on top of me. 0 -  I have been so unhappy that I have had difficulty sleeping. 0 -  I have felt sad or miserable. 0 -  I have been so unhappy that I have been crying. 0 -  The thought of harming myself has occurred to me. 0 -  Edinburgh Postnatal Depression Scale Total 1 -    Post partum course:   Patient had an uncomplicated postpartum course.  By time of discharge on PPD#1, her pain was controlled on oral pain medications; she had appropriate lochia and was ambulating, voiding without difficulty and tolerating regular diet.  She was deemed stable for discharge to home.     Discharge Physical Exam:  BP 120/71 (BP Location: Left Arm)   Pulse 79   Temp 98.3 F (36.8 C) (Oral)   Resp 18   Ht 5\' 4"  (1.626 m)   Wt 99.8 kg   LMP 03/17/2020 (Exact Date)   SpO2 100%   Breastfeeding Unknown   BMI 37.76 kg/m   General: NAD CV: RRR Pulm: CTABL, nl effort ABD: s/nd/nt, fundus firm and below the umbilicus Lochia: moderate Perineum: minimal edema, repair well approximated  DVT Evaluation: LE non-ttp, no evidence of DVT on exam.  Hemoglobin  Date Value Ref Range Status  12/16/2020 11.0 (L) 12.0 - 15.0 g/dL Final   HCT  Date Value Ref Range Status  12/16/2020 32.8 (L) 36.0 - 46.0 % Final     Disposition: stable, discharge to home. Baby Feeding: breastmilk Baby Disposition: home with mom  Rh Immune globulin given: Rh pos Rubella vaccine given: Immune  Tdap vaccine given in AP or PP setting: given 10/07/2020 Flu vaccine given in AP or PP setting: given 07/29/2020  Contraception: Nexplanon   Prenatal Labs:  Blood type/Rh O pos  Antibody screen neg  Rubella Immune  Varicella Immune  RPR NR  HBsAg Neg  HIV NR  GC neg  Chlamydia neg  Genetic screening negative  1 hour GTT 217  3 hour  GTT   GBS Neg      Plan:  Bena Kobel was discharged to home in good condition. Follow-up appointment with delivering provider in 6 weeks.  Discharge Medications: Allergies as of 12/17/2020      Reactions   Red Dye Nausea And Vomiting   Tree Extract    Tree nuts- ear and throat gets itchy.       Medication List    STOP taking these medications   diphenhydrAMINE 25 MG tablet Commonly known as: SOMINEX   insulin NPH Human 100 UNIT/ML injection Commonly known as: NOVOLIN  N   insulin regular 100 units/mL injection Commonly known as: NOVOLIN R   INSULIN SYRINGE 1CC/31GX5/16" 31G X 5/16" 1 ML Misc   metFORMIN 500 MG 24 hr tablet Commonly known as: GLUCOPHAGE-XR     TAKE these medications   acetaminophen 325 MG tablet Commonly known as: Tylenol Take 2 tablets (650 mg total) by mouth every 4 (four) hours as needed (for pain scale < 4).   fluticasone 50 MCG/ACT nasal spray Commonly known as: FLONASE Place 1 spray into the nose daily as needed.   ibuprofen 600 MG tablet Commonly known as: ADVIL Take 1 tablet (600 mg total) by mouth every 6 (six) hours as needed.   levothyroxine 50 MCG tablet Commonly known as: SYNTHROID Take 50 mcg by mouth daily.   PRENATAL FA PO Take by mouth daily.        Follow-up Information    Schermerhorn, Ihor Austin, MD. Go on 01/27/2021.   Specialty: Obstetrics and Gynecology Why: Arrive 15 minutes early to your appointment to go get your labs done and Nexplanon insertion! Follow-up appointment: Friday, 01/27/21 at 3:00pm (check in at 2:45pm)  Contact information: [address] [place] [address] [phone]               Signed:  Margaretmary Eddy, CNM Certified Nurse Midwife [facility]

## 2020-12-16 NOTE — Discharge Instructions (Signed)
Discharge Instructions:   Follow-up Appointment: Arrive 15 minutes early to your appointment to go get your labs done and Nexplanon insertion! Friday, 01/27/21 at 3:00pm (check in at 2:45pm)   If there are any new medications, they have been ordered and will be available for pickup at the listed pharmacy on your way home from the hospital.   Call office if you have any of the following: headache, visual changes, fever >101.0 F, chills, shortness of breath, breast concerns, excessive vaginal bleeding, incision drainage or problems, leg pain or redness, depression or any other concerns. If you have vaginal discharge with an odor, let your doctor know.   It is normal to bleed for up to 6 weeks. You should not soak through more than 1 pad in 1 hour. If you have a blood clot larger than your fist with continued bleeding, call your doctor.   Activity: Do not lift > 10 lbs for 6 weeks (do not lift anything heavier than your baby). No intercourse, tampons, swimming pools, hot tubs, baths (only showers) for 6 weeks.  No driving for 1-2 weeks. Continue prenatal vitamin, especially if breastfeeding. Increase calories and fluids (water) while breastfeeding.   Your milk will come in, in the next couple of days (right now it is colostrum). You may have a slight fever when your milk comes in, but it should go away on its own.  If it does not, and rises above 101 F please call the doctor. You will also feel achy and your breasts will be firm. They will also start to leak. If you are breastfeeding, continue as you have been and you can pump/express milk for comfort.   If you have too much milk, your breasts can become engorged, which could lead to mastitis. This is an infection of the milk ducts. It can be very painful and you will need to notify your doctor to obtain a prescription for antibiotics. You can also treat it with a shower or hot/cold compress.   For concerns about your baby, please call your  pediatrician.  For breastfeeding concerns, the lactation consultant can be reached at [phone].   Postpartum blues (feelings of happy one minute and sad another minute) are normal for the first few weeks but if it gets worse let your doctor know.   Congratulations! We enjoyed caring for you and your new bundle of joy!

## 2020-12-17 LAB — GLUCOSE, CAPILLARY: Glucose-Capillary: 99 mg/dL (ref 70–99)

## 2020-12-17 MED ORDER — ACETAMINOPHEN 325 MG PO TABS: 650.0000 mg | ORAL_TABLET | ORAL | Status: DC | PRN

## 2020-12-17 MED ORDER — IBUPROFEN 600 MG PO TABS: 600.0000 mg | ORAL_TABLET | Freq: Four times a day (QID) | ORAL | 1 refills | Status: DC | PRN

## 2020-12-17 NOTE — Lactation Note (Signed)
This note was copied from a baby's chart. Lactation Consultation Note  Patient Name: Ann Cooper [id number] Date: 12/17/2020 Reason for consult: Follow-up assessment;1st time breastfeeding;Difficult latch Age:37 hours    Lactation Rounds: LC to the room for a visit. Mother and baby are resting.  Mother states feeds are going better today since 0500. Baby was crying last night and difficult to feed. LC reviewed and encouraged feeding on demand and with cues. If baby is not cueing we encourage hand expression and spoon feed to wake baby. Reviewed diaper counts for days of life and when to call Peds with questions. Reviewed "understanding Postpartum and Newborn care " booklet at bedside. Reviewed outpatient Lactation number and resources. [employer]mom[facility].com and [email or web address].com resources, encouraged downloading an app for tracking feeds and diapers. Reviewed pacifier, pumping, and bottles are not encouraged until breastfeeding is established and going well in the first 4 weeks. Parents stated understanding with all teaching. LC encouraged Mother to call when baby went to breast the next time. LC wanted to visualize feed.   1210: Mother called out, baby was showing cues. LC to the room. Mother had started using electric pump on her right nipple to pull nipple out prior to placing shield. Mother placed nipple shield. Baby was then latched to shield after several attempts. LC encouraged stroking the nipple from his nose to his chin to and wait for a wide gape. Baby was then latched and a began to suckle with a non-nutritive sucking pattern. He was sleepy at the breast but then cry when removed. LC noted no swallows at breast. Encouraged supplement due to poor feeding pattern.  DBM was initiated at the breast with the SNS, on the right in cross cradle. He required some pacing but tolerated 51ml's well and fell asleep, with his arms relaxed and fists open(satieted). LC noted he is clicking/popping when he is  swallowing with the supplement. LC performed finger sweep and noted a frenulum under his tongue. Encouraged outpatient follow up if baby is still causing damage or not gaining weight well but 10-14 days of life. Gave handout with Dr. Lexine Baton or Dr. Rogelia Rohrer for tongue tie evaluation.  Mother was encouraged to pump 8x's/24 hours.  since baby is taking supplement.   1500: LC called back for another feed at breast. FOB had to run home. Mother is requiring a small amount of assistance but is reviewing the steps needed to start the SNS. Mother placed baby in cross cradle on the left, but half way through the feed switched to football and stated it felt better. Mother is feeling more confident in her feeding abilities. Baby took all 54ml's of DBM supplement and did well. Reviewed and encouraged 8 or more good feeds in 24 hours. Mother's mature milk should transition in close to the next 48 hours, baby should be able to start to decrease supplement at the breast once Mother has more volume, but encouraged outpatient follow up to ensure baby's suck is effective.    Maternal Data Has patient been taught Hand Expression?: Yes Does the patient have breastfeeding experience prior to this delivery?: No  Feeding Mother's Current Feeding Choice: Breast Milk and Donor Milk  LATCH Score Latch: Grasps breast easily, tongue down, lips flanged, rhythmical sucking. (with SNS DBM at breast)  Audible Swallowing: A few with stimulation  Type of Nipple: Everted at rest and after stimulation  Comfort (Breast/Nipple): Filling, red/small blisters or bruises, mild/mod discomfort  Hold (Positioning): Assistance needed to correctly position infant at breast  and maintain latch.  LATCH Score: 7   Lactation Tools Discussed/Used Tools: Pump;Nipple Dorris Carnes;Supplemental Nutrition System Nipple shield size: 24 Breast pump type: Double-Electric Breast Pump Pump Education: Setup, frequency, and cleaning;Milk Storage Reason for  Pumping: to evert nipple prior to shield and due to supplement Pumping frequency: q3hrs Pumped volume:  (drops)  Interventions Interventions: Breast feeding basics reviewed;Assisted with latch;Hand express;Breast compression;Adjust position;Support pillows;Position options;DEBP;Education  Discharge Discharge Education: Engorgement and breast care;Warning signs for feeding baby;Outpatient recommendation (Gave info on Tongue tie evaluation) Pump: Personal  Consult Status Consult Status: PRN Follow-up type: Call as needed    Maecyn Panning D Serria Sloma 12/17/2020, 4:38 PM

## 2020-12-17 NOTE — Progress Notes (Signed)
Patient discharged home with infant. FOB present at discharge. Discharge instructions and prescriptions given and reviewed with patient. Patient verbalized understanding.   Follow-up appointment scheduled in 6 weeks with Dr. Feliberto Gottron with Nexplanon insertion at appointment.  Escorted out by staff.

## 2021-03-22 ENCOUNTER — Other Ambulatory Visit: Payer: Self-pay

## 2021-03-22 ENCOUNTER — Telehealth (INDEPENDENT_AMBULATORY_CARE_PROVIDER_SITE_OTHER): Payer: Managed Care, Other (non HMO) | Admitting: Family Medicine

## 2021-03-22 ENCOUNTER — Telehealth: Payer: Self-pay

## 2021-03-22 VITALS — Wt 190.0 lb

## 2021-03-22 DIAGNOSIS — U071 COVID-19: Secondary | ICD-10-CM | POA: Diagnosis not present

## 2021-03-22 NOTE — Telephone Encounter (Signed)
Attempted to schedule pt for virtual today. Vm was left.

## 2021-03-22 NOTE — Telephone Encounter (Signed)
Pt doing virtual visit today at 12:20.

## 2021-03-22 NOTE — Telephone Encounter (Signed)
Spoke w pt about FMLA  Beginning paperwork now

## 2021-03-22 NOTE — Telephone Encounter (Signed)
Can we see if patient is able to do a virtual visit today?

## 2021-03-22 NOTE — Telephone Encounter (Signed)
Semi completed FMLA paperwork placed in PCPs inbox for review, completion, sign and date  This pt tested positive for COVID on 03/16/2021.  Pt will re-test again Saturday in hopes of returning to work  Not sure if she will need a visit Dr Selena Batten prior to being able to complete paperwork

## 2021-03-22 NOTE — Progress Notes (Signed)
    I connected with Ann Cooper on 03/22/21 at 12:20 PM EDT by video and verified that I am speaking with the correct person using two identifiers.   I discussed the limitations, risks, security and privacy concerns of performing an evaluation and management service by video and the availability of in person appointments. I also discussed with the patient that there may be a patient responsible charge related to this service. The patient expressed understanding and agreed to proceed.  Patient location: Home Provider Location: [place] Participants: Lynnda Child and Jaci Carrel   Subjective:     Ann Cooper is a 35 y.o. 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  - current symptoms has a cough, fatigue - headache is improving - no fever - sore throat has resolved - also has a newborn so not sleeping due to that  - cough is also improving  - last fever 2 days ago  Review of Systems   Social History   Tobacco Use  Smoking Status Never Smoker  Smokeless Tobacco Never Used        Objective:   BP Readings from Last 3 Encounters:  12/17/20 118/71  11/01/20 100/62  10/06/20 98/60   Wt Readings from Last 3 Encounters:  03/22/21 190 lb (86.2 kg)  12/15/20 220 lb (99.8 kg)  11/01/20 212 lb 3.2 oz (96.3 kg)    Wt 190 lb (86.2 kg)   BMI 32.61 kg/m    Physical Exam Constitutional:      Appearance: Normal appearance. She is not ill-appearing.  HENT:     Head: Normocephalic and atraumatic.     Right Ear: External ear normal.     Left Ear: External ear normal.  Eyes:     Conjunctiva/sclera: Conjunctivae normal.  Pulmonary:     Effort: Pulmonary effort is normal. No respiratory distress.  Neurological:     Mental Status: She is alert. Mental status is at baseline.  Psychiatric:         Mood and Affect: Mood normal.        Behavior: Behavior normal.        Thought Content: Thought content normal.        Judgment: Judgment normal.            Assessment & Plan:   Problem List Items Addressed This Visit   None   Visit Diagnoses    COVID-19 virus infection    -  Primary     Symptoms improving. Letter for work. FMLA completed  ER precautions reviewed  Return if symptoms worsen or fail to improve.  Lynnda Child, MD

## 2021-03-23 NOTE — Telephone Encounter (Signed)
Called and informed pt FMLA paperwork is completed and faxed  Copy mailed to pt  Copy for scan   Copy retained by me

## 2021-04-27 ENCOUNTER — Other Ambulatory Visit: Payer: Self-pay | Admitting: Podiatry

## 2021-04-27 DIAGNOSIS — M79672 Pain in left foot: Secondary | ICD-10-CM

## 2021-04-27 DIAGNOSIS — M84375G Stress fracture, left foot, subsequent encounter for fracture with delayed healing: Secondary | ICD-10-CM

## 2021-05-08 ENCOUNTER — Ambulatory Visit
Admission: RE | Admit: 2021-05-08 | Discharge: 2021-05-08 | Disposition: A | Discharge status: Home / Self Care | Payer: Managed Care, Other (non HMO) | Source: Ambulatory Visit | Attending: Podiatry | Admitting: Podiatry

## 2021-05-08 ENCOUNTER — Other Ambulatory Visit: Payer: Self-pay

## 2021-05-08 DIAGNOSIS — M79672 Pain in left foot: Secondary | ICD-10-CM | POA: Insufficient documentation

## 2021-05-08 DIAGNOSIS — M84375G Stress fracture, left foot, subsequent encounter for fracture with delayed healing: Secondary | ICD-10-CM | POA: Insufficient documentation

## 2021-06-16 ENCOUNTER — Telehealth: Payer: Self-pay

## 2021-06-16 NOTE — Telephone Encounter (Signed)
Pt left a message on triage VM about adding on Thyroid labs to labs she had done yesterday. I called her back to verify because she had not been seen here recently. She said she went through work Doctor, hospital) and did the yearly exam with them and had labs done at [facility]. I advised her that Dr Selena Batten could not add on to labs that we did not order. We got disconnected or she lost signal. I called her back and left a message with the same info.

## 2021-07-04 ENCOUNTER — Encounter: Payer: Self-pay | Admitting: Family Medicine

## 2021-07-13 ENCOUNTER — Other Ambulatory Visit: Payer: Self-pay

## 2021-07-13 ENCOUNTER — Encounter: Payer: Self-pay | Admitting: Family Medicine

## 2021-07-13 ENCOUNTER — Ambulatory Visit (INDEPENDENT_AMBULATORY_CARE_PROVIDER_SITE_OTHER): Payer: Managed Care, Other (non HMO) | Admitting: Family Medicine

## 2021-07-13 VITALS — BP 110/70 | HR 72 | Temp 97.0°F | Ht 63.75 in | Wt 197.0 lb

## 2021-07-13 DIAGNOSIS — Z23 Encounter for immunization: Secondary | ICD-10-CM | POA: Diagnosis not present

## 2021-07-13 DIAGNOSIS — R7303 Prediabetes: Secondary | ICD-10-CM

## 2021-07-13 DIAGNOSIS — Z0000 Encounter for general adult medical examination without abnormal findings: Secondary | ICD-10-CM

## 2021-07-13 DIAGNOSIS — E039 Hypothyroidism, unspecified: Secondary | ICD-10-CM

## 2021-07-13 NOTE — Progress Notes (Signed)
Annual Exam   Chief Complaint:  Chief Complaint  Patient presents with   Annual Exam    Concerned with 5.9 A1C that was checked in August with [facility].  Would like thyroid levels checked.     History of Present Illness:  [name] is a 35 y.o. G1P1001 who LMP was No LMP recorded. Patient has had an implant., presents today for her annual examination.      Nutrition/Lifestyle Diet: not as good, planning to get back on track with diet Exercise: starting to walk again after foot injury She does get adequate calcium and Vitamin D in her diet.  Social History   Tobacco Use  Smoking Status Never  Smokeless Tobacco Never   Social History   Substance and Sexual Activity  Alcohol Use Not Currently   Comment: once a month   Social History   Substance and Sexual Activity  Drug Use Never     Safety The patient wears seatbelts: yes.     The patient feels safe at home and in their relationships: yes.  General Health Dentist in the last year: Yes Eye doctor: yes  Menstrual Nexplanon - irregular cycles  GYN She is single partner, contraception - nexplanon.     Cervical Cancer Screening (Age 80-65) Last Pap:  October 2017 Results were: no abnormalities with HPV negative  Family History of Breast Cancer: no Family History of Ovarian Cancer: no    Weight Wt Readings from Last 3 Encounters:  07/13/21 197 lb (89.4 kg)  03/22/21 190 lb (86.2 kg)  12/15/20 220 lb (99.8 kg)   Patient has high BMI  BMI Readings from Last 1 Encounters:  07/13/21 34.08 kg/m     Chronic disease screening Blood pressure monitoring:  BP Readings from Last 3 Encounters:  07/13/21 110/70  12/17/20 118/71  11/01/20 100/62     Lipid Monitoring: Indication for screening: age >71, obesity, diabetes, family hx, CV risk factors.  Lipid screening: Yes  Lab Results  Component Value Date   CHOL 225 (H) 11/26/2019   HDL 71 11/26/2019   LDLCALC 135 (H) 11/26/2019   TRIG 107  11/26/2019   CHOLHDL 3.2 11/26/2019     Diabetes Screening: age >61, overweight, family hx, PCOS, hx of gestational diabetes, at risk ethnicity, elevated blood pressure >135/80.  Diabetes Screening screening: Yes  Lab Results  Component Value Date   HGBA1C 5.6 11/26/2019      Past Medical History:  Diagnosis Date   Allergy    Depression    Gestational diabetes    History of chicken pox    History of hypothyroidism    Thyroid disease     Past Surgical History:  Procedure Laterality Date   NPFL left knee  02/2011   and menscus repair    Prior to Admission medications   Medication Sig Start Date End Date Taking? Authorizing Provider  acetaminophen (TYLENOL) 325 MG tablet Take 2 tablets (650 mg total) by mouth every 4 (four) hours as needed (for pain scale < 4). 12/17/20  Yes Gustavo Lah, CNM  fluticasone [facility]) 50 MCG/ACT nasal spray Place 1 spray into the nose daily as needed.  01/15/18  Yes [provider]  ibuprofen (ADVIL) 600 MG tablet Take 1 tablet (600 mg total) by mouth every 6 (six) hours as needed. 12/17/20  Yes Gustavo Lah, CNM  levothyroxine (SYNTHROID) 50 MCG tablet Take 50 mcg by mouth daily. 08/24/20 08/24/21 Yes [provider]    Allergies  Allergen  Reactions   Red Dye Nausea And Vomiting   Tree Extract     Tree nuts- ear and throat gets itchy.     Gynecologic History: No LMP recorded. Patient has had an implant.  Obstetric History: G1P1001  Social History   Socioeconomic History   Marital status: Married    Spouse name: Onalee Hua   Number of children: Not on file   Years of education: Bachelors degree   Highest education level: Not on file  Occupational History   Not on file  Tobacco Use   Smoking status: Never   Smokeless tobacco: Never  Vaping Use   Vaping Use: Never used  Substance and Sexual Activity   Alcohol use: Not Currently    Comment: once a month   Drug use: Never   Sexual activity: Yes    Birth  control/protection: None  Other Topics Concern   Not on file  Social History Narrative   Lives with Onalee Hua   Pets: dog, cat, and bird   Works at [employer]   Enjoys: walks with the dog, watching sports, puzzles   Exercise: started back at the [facility] - 5 times a week, walking on the treadmill 3.2 miles   Diet: pretty healthy, whole foods delivery - low carbs, meat/fruit/veggies   Social Determinants of Health   Financial Resource Strain: Not on file  Food Insecurity: Not on file  Transportation Needs: Not on file  Physical Activity: Not on file  Stress: Not on file  Social Connections: Not on file  Intimate Partner Violence: Not on file    Family History  Problem Relation Age of Onset   Arthritis Mother    Asthma Mother    Depression Mother    Miscarriages / [place] Mother    Asthma Sister    Depression Sister    Arthritis Maternal Grandmother    Asthma Maternal Grandmother    Depression Maternal Grandmother    Scoliosis Maternal Grandmother    Alcohol abuse Maternal Grandfather    Diabetes Maternal Grandfather     Review of Systems  Constitutional:  Negative for chills and fever.  HENT:  Negative for congestion and sore throat.   Eyes:  Negative for blurred vision and double vision.  Respiratory:  Negative for shortness of breath.   Cardiovascular:  Negative for chest pain.  Gastrointestinal:  Negative for heartburn, nausea and vomiting.  Genitourinary: Negative.   Musculoskeletal: Negative.  Negative for myalgias.  Skin:  Negative for rash.  Neurological:  Negative for dizziness and headaches.  Endo/Heme/Allergies:  Does not bruise/bleed easily.  Psychiatric/Behavioral:  Negative for depression. The patient is not nervous/anxious.     Physical Exam BP 110/70   Pulse 72   Temp (!) 97 F (36.1 C) (Temporal)   Ht 5' 3.75" (1.619 m)   Wt 197 lb (89.4 kg)   SpO2 98%   Breastfeeding No   BMI 34.08 kg/m    BP Readings from Last 3 Encounters:  07/13/21 110/70   12/17/20 118/71  11/01/20 100/62    Wt Readings from Last 3 Encounters:  07/13/21 197 lb (89.4 kg)  03/22/21 190 lb (86.2 kg)  12/15/20 220 lb (99.8 kg)     Physical Exam Constitutional:      General: She is not in acute distress.    Appearance: She is well-developed. She is not diaphoretic.  HENT:     Head: Normocephalic and atraumatic.     Right Ear: External ear normal.     Left Ear: External  ear normal.     Nose: Nose normal.  Eyes:     General: No scleral icterus.    Extraocular Movements: Extraocular movements intact.     Conjunctiva/sclera: Conjunctivae normal.  Cardiovascular:     Rate and Rhythm: Normal rate and regular rhythm.     Heart sounds: No murmur heard. Pulmonary:     Effort: Pulmonary effort is normal. No respiratory distress.     Breath sounds: Normal breath sounds. No wheezing.  Abdominal:     General: Bowel sounds are normal. There is no distension.     Palpations: Abdomen is soft. There is no mass.     Tenderness: There is no abdominal tenderness. There is no guarding or rebound.  Musculoskeletal:        General: Normal range of motion.     Cervical back: Neck supple.  Lymphadenopathy:     Cervical: No cervical adenopathy.  Skin:    General: Skin is warm and dry.     Capillary Refill: Capillary refill takes less than 2 seconds.  Neurological:     Mental Status: She is alert and oriented to person, place, and time.     Deep Tendon Reflexes: Reflexes normal.  Psychiatric:        Mood and Affect: Mood normal.        Behavior: Behavior normal.      Results: Depression screen [facility] 2/9 09/30/2020 12/16/2018  Decreased Interest 0 0  Down, Depressed, Hopeless 0 0  PHQ - 2 Score 0 0  Altered sleeping - 0  Tired, decreased energy - 0  Change in appetite - 0  Feeling bad or failure about yourself  - 0  Trouble concentrating - 0  Moving slowly or fidgety/restless - 0  Suicidal thoughts - 0  PHQ-9 Score - 0  Difficult doing work/chores - Not  difficult at all      Assessment: 35 y.o. G104P1001 female here for routine annual examination.  Plan: Problem List Items Addressed This Visit       Endocrine   Hypothyroidism   Relevant Orders   TSH     Other   Prediabetes   Other Visit Diagnoses     Annual physical exam    -  Primary   Relevant Orders   Comprehensive metabolic panel   Lipid panel   CBC   Need for influenza vaccination       Relevant Orders   Flu Vaccine QUAD 57mo+IM (Fluarix, Fluzone & Alfiuria Quad PF)        Screening: -- Blood pressure screen normal -- cholesterol screening: will obtain -- Weight screening: overweight: continue to monitor -- Diabetes Screening: not due for screening -- Nutrition: encouraged healthy diet   Psych -- Depression screening (PHQ-9):  Flowsheet Row Office Visit from 12/16/2018 in [facility]  PHQ-9 Total Score 0        Safety -- tobacco screening: not using -- alcohol screening:  low-risk usage. -- no evidence of domestic violence or intimate partner violence.   Cancer Screening -- pap smear collected per ASCCP guidelines -- family history of breast cancer screening: done. not at high risk.   Immunizations Immunization History  Administered Date(s) Administered   Influenza,inj,Quad PF,6+ Mos 08/17/2018, 11/26/2019   PFIZER Comirnaty(Gray Top)Covid-19 Tri-Sucrose Vaccine 12/24/2019, 01/14/2020, 01/27/2020, 08/17/2020   Tdap 08/01/2016    -- flu vaccine up to date -- TDAP q10 years up to date -- Covid-19 Vaccine not up to date - will get in  a few weeks  Encouraged regular vision and dental screening. Encouraged healthy exercise and diet.   Lynnda Child

## 2021-07-13 NOTE — Patient Instructions (Signed)
Your screen for diabetes shows that you have prediabetes. This means that you are at risk for developing diabetes.   You can slow the progression to diabetes through healthy diet and exercise.   Eat healthy foods Get at least 150 minutes of moderate aerobic physical activity a week, or about 30 minutes on most days of the week Lose excess weight Control your blood pressure and cholesterol Don't smoke  We should repeat this test next year to monitor for changes.   

## 2021-07-14 LAB — CBC
Hematocrit: 42.3 % (ref 34.0–46.6)
Hemoglobin: 14.2 g/dL (ref 11.1–15.9)
MCH: 30.2 pg (ref 26.6–33.0)
MCHC: 33.6 g/dL (ref 31.5–35.7)
MCV: 90 fL (ref 79–97)
Platelets: 210 10*3/uL (ref 150–450)
RBC: 4.7 x10E6/uL (ref 3.77–5.28)
RDW: 12.2 % (ref 11.7–15.4)
WBC: 4.8 10*3/uL (ref 3.4–10.8)

## 2021-07-14 LAB — COMPREHENSIVE METABOLIC PANEL
ALT: 19 IU/L (ref 0–32)
AST: 15 IU/L (ref 0–40)
Albumin/Globulin Ratio: 1.8 (ref 1.2–2.2)
Albumin: 4.6 g/dL (ref 3.8–4.8)
Alkaline Phosphatase: 66 IU/L (ref 44–121)
BUN/Creatinine Ratio: 18 (ref 9–23)
BUN: 13 mg/dL (ref 6–20)
Bilirubin Total: 0.3 mg/dL (ref 0.0–1.2)
CO2: 18 mmol/L — ABNORMAL LOW (ref 20–29)
Calcium: 9.2 mg/dL (ref 8.7–10.2)
Chloride: 105 mmol/L (ref 96–106)
Creatinine, Ser: 0.71 mg/dL (ref 0.57–1.00)
Globulin, Total: 2.5 g/dL (ref 1.5–4.5)
Glucose: 102 mg/dL — ABNORMAL HIGH (ref 65–99)
Potassium: 4.2 mmol/L (ref 3.5–5.2)
Sodium: 138 mmol/L (ref 134–144)
Total Protein: 7.1 g/dL (ref 6.0–8.5)
eGFR: 112 mL/min/{1.73_m2} (ref >59)

## 2021-07-14 LAB — LIPID PANEL
Chol/HDL Ratio: 4.2 ratio (ref 0.0–4.4)
Cholesterol, Total: 199 mg/dL (ref 100–199)
HDL: 47 mg/dL (ref >39)
LDL Chol Calc (NIH): 135 mg/dL — ABNORMAL HIGH (ref 0–99)
Triglycerides: 95 mg/dL (ref 0–149)
VLDL Cholesterol Cal: 17 mg/dL (ref 5–40)

## 2021-07-14 LAB — TSH: TSH: 1.07 u[IU]/mL (ref 0.450–4.500)

## 2021-11-05 ENCOUNTER — Encounter: Payer: Self-pay | Admitting: Emergency Medicine

## 2021-11-05 ENCOUNTER — Ambulatory Visit
Admission: EM | Admit: 2021-11-05 | Discharge: 2021-11-05 | Disposition: A | Discharge status: Home / Self Care | Payer: Managed Care, Other (non HMO) | Attending: Medical Oncology | Admitting: Medical Oncology

## 2021-11-05 DIAGNOSIS — Z20828 Contact with and (suspected) exposure to other viral communicable diseases: Secondary | ICD-10-CM

## 2021-11-05 MED ORDER — OSELTAMIVIR PHOSPHATE 75 MG PO CAPS: 75.0000 mg | ORAL_CAPSULE | Freq: Every day | ORAL | 0 refills | Status: DC

## 2021-11-05 NOTE — ED Triage Notes (Signed)
Pts mom tested positive for Flu B

## 2021-11-05 NOTE — ED Provider Notes (Addendum)
Renaldo Fiddler    CSN: [id number] Arrival date & time: 11/05/21  1343      History   Chief Complaint Chief Complaint  Patient presents with   Flu exposure    HPI Ann Cooper is a 36 y.o. female.   HPI  Influenza exposure: Patient presents with her son and husband.  They are both sick with suspected influenza as they have a close contact with confirmed influenza as well.  She currently does not have any symptoms such as cough, fever or shortness of breath.  She would like antiviral treatment as prevention.  Past Medical History:  Diagnosis Date   Allergy    Depression    Gestational diabetes    History of chicken pox    History of hypothyroidism    Thyroid disease     Patient Active Problem List   Diagnosis Date Noted   Hypothyroidism 07/13/2021   Prediabetes 12/15/2020   Menstrual cramp 11/26/2019   Cervical radiculopathy 10/29/2018   Obesity (BMI 30.0-34.9) 08/01/2016    Past Surgical History:  Procedure Laterality Date   NPFL left knee  02/2011   and menscus repair    OB History     Gravida  1   Para  1   Term  1   Preterm      AB      Living  1      SAB      IAB      Ectopic      Multiple  0   Live Births  1            Home Medications    Prior to Admission medications   Medication Sig Start Date End Date Taking? Authorizing Provider  oseltamivir (TAMIFLU) 75 MG capsule Take 1 capsule (75 mg total) by mouth daily. 11/05/21  Yes Garfield Coiner M, PA-C  acetaminophen (TYLENOL) 325 MG tablet Take 2 tablets (650 mg total) by mouth every 4 (four) hours as needed (for pain scale < 4). 12/17/20   Gustavo Lah, CNM  etonogestrel (NEXPLANON) 68 MG IMPL implant 1 each by Subdermal route once.    [provider]  fluticasone (FLONASE) 50 MCG/ACT nasal spray Place 1 spray into the nose daily as needed.  01/15/18   [provider]  ibuprofen (ADVIL) 600 MG tablet Take 1 tablet (600 mg total) by mouth every 6 (six)  hours as needed. 12/17/20   Gustavo Lah, CNM  levothyroxine (SYNTHROID) 50 MCG tablet Take 50 mcg by mouth daily. 08/24/20 08/24/21  [provider]    Family History Family History  Problem Relation Age of Onset   Arthritis Mother    Asthma Mother    Depression Mother    Miscarriages / [place] Mother    Asthma Sister    Depression Sister    Arthritis Maternal Grandmother    Asthma Maternal Grandmother    Depression Maternal Grandmother    Scoliosis Maternal Grandmother    Alcohol abuse Maternal Grandfather    Diabetes Maternal Grandfather     Social History Social History   Tobacco Use   Smoking status: Never   Smokeless tobacco: Never  Vaping Use   Vaping Use: Never used  Substance Use Topics   Alcohol use: Not Currently    Comment: once a month   Drug use: Never     Allergies   Red dye and Tree extract   Review of Systems Review of Systems  As stated above  in HPI Physical Exam Triage Vital Signs ED Triage Vitals [11/05/21 1346]  Enc Vitals Group     BP 117/81     Pulse Rate 93     Resp 16     Temp 98.2 F (36.8 C)     Temp Source Oral     SpO2 96 %     Weight      Height      Head Circumference      Peak Flow      Pain Score      Pain Loc      Pain Edu?      Excl. in GC?    No data found.  Updated Vital Signs BP 117/81 (BP Location: Left Arm)    Pulse 93    Temp 98.2 F (36.8 C) (Oral)    Resp 16    SpO2 96%   Physical Exam Vitals and nursing note reviewed.  Constitutional:      General: She is not in acute distress.    Appearance: Normal appearance. She is not ill-appearing, toxic-appearing or diaphoretic.  HENT:     Head: Normocephalic and atraumatic.     Right Ear: Tympanic membrane normal.     Left Ear: Tympanic membrane normal.     Nose: Nose normal.     Mouth/Throat:     Mouth: Mucous membranes are moist.     Pharynx: No oropharyngeal exudate or posterior oropharyngeal erythema.  Eyes:     Extraocular  Movements: Extraocular movements intact.     Pupils: Pupils are equal, round, and reactive to light.  Cardiovascular:     Rate and Rhythm: Normal rate and regular rhythm.     Heart sounds: Normal heart sounds.  Pulmonary:     Effort: Pulmonary effort is normal.     Breath sounds: Normal breath sounds.  Musculoskeletal:     Cervical back: Neck supple.  Lymphadenopathy:     Cervical: No cervical adenopathy.  Skin:    General: Skin is warm.  Neurological:     Mental Status: She is alert and oriented to person, place, and time.     UC Treatments / Results  Labs (all labs ordered are listed, but only abnormal results are displayed) Labs Reviewed - No data to display  EKG   Radiology No results found.  Procedures Procedures (including critical care time)  Medications Ordered in UC Medications - No data to display  Initial Impression / Assessment and Plan / UC Course  I have reviewed the triage vital signs and the nursing notes.  Pertinent labs & imaging results that were available during my care of the patient were reviewed by me and considered in my medical decision making (see chart for details).     New.  Prophylactic Tamiflu discussed and prescribed for patient.  She will continue staying rested and hydrating.  We did discuss that if she develops flulike symptoms then she will switch from once daily dosing to twice daily dosing. Follow up PRN.  Final Clinical Impressions(s) / UC Diagnoses   Final diagnoses:  Exposure to influenza   Discharge Instructions   None    ED Prescriptions     Medication Sig Dispense Auth. Provider   oseltamivir (TAMIFLU) 75 MG capsule Take 1 capsule (75 mg total) by mouth daily. 10 capsule Rushie Chestnut, [place]      PDMP not reviewed this encounter.   Rushie Chestnut, Cordelia Poche 11/05/21 1354    Rushie Chestnut, PA-C 11/05/21  1355 ° °

## 2022-01-10 ENCOUNTER — Other Ambulatory Visit: Payer: Self-pay | Admitting: Family Medicine

## 2022-01-15 MED ORDER — FLUTICASONE PROPIONATE 50 MCG/ACT NA SUSP: 1.0000 | Freq: Every day | NASAL | 1 refills | Status: DC | PRN

## 2022-01-15 MED ORDER — LEVOTHYROXINE SODIUM 50 MCG PO TABS: 50.0000 ug | ORAL_TABLET | Freq: Every day | ORAL | 1 refills | Status: DC

## 2022-02-02 ENCOUNTER — Encounter: Payer: Self-pay | Admitting: Family Medicine

## 2022-02-02 MED ORDER — LEVOTHYROXINE SODIUM 50 MCG PO TABS: 50.0000 ug | ORAL_TABLET | Freq: Every day | ORAL | 1 refills | Status: DC

## 2022-03-05 ENCOUNTER — Ambulatory Visit: Payer: Managed Care, Other (non HMO) | Admitting: Family Medicine

## 2022-03-05 ENCOUNTER — Encounter: Payer: Self-pay | Admitting: Family Medicine

## 2022-03-05 VITALS — BP 100/70 | HR 83 | Temp 98.0°F | Ht 63.75 in | Wt 197.6 lb

## 2022-03-05 DIAGNOSIS — R7303 Prediabetes: Secondary | ICD-10-CM | POA: Diagnosis not present

## 2022-03-05 DIAGNOSIS — R739 Hyperglycemia, unspecified: Secondary | ICD-10-CM | POA: Diagnosis not present

## 2022-03-05 LAB — POCT GLYCOSYLATED HEMOGLOBIN (HGB A1C): Hemoglobin A1C: 5.8 % — AB (ref 4.0–5.6)

## 2022-03-05 MED ORDER — METFORMIN HCL 500 MG PO TABS: 500.0000 mg | ORAL_TABLET | Freq: Two times a day (BID) | ORAL | 3 refills | Status: DC

## 2022-03-05 NOTE — Progress Notes (Signed)
? ?  Subjective:  ? ?  ?Ann Cooper is a 36 y.o. female presenting for Advice Only (Worried about diabetes based on recent eye exam ) ?  ? ? ?HPI ? ?Lab Results  ?Component Value Date  ? HGBA1C 5.8 (A) 03/05/2022  ? ?#prediabetes ?- was told her blood vessels in her eye was "boxcarring" ?- did take metformin in pregnancy in did ok before needing insuline ?- has been exercising - walking daily with kids ?- has been eating heal ?- infant 11/2020 ?- did lose to her pre-pregnancy weight ?- did have a foot injury ? ? ? ?Review of Systems ? ? ?Social History  ? ?Tobacco Use  ?Smoking Status Never  ?Smokeless Tobacco Never  ? ? ? ?   ?Objective:  ?  ?BP Readings from Last 3 Encounters:  ?03/05/22 100/70  ?11/05/21 117/81  ?07/13/21 110/70  ? ?Wt Readings from Last 3 Encounters:  ?03/05/22 197 lb 9 oz (89.6 kg)  ?07/13/21 197 lb (89.4 kg)  ?03/22/21 190 lb (86.2 kg)  ? ? ?BP 100/70   Pulse 83   Temp 98 ?F (36.7 ?C) (Oral)   Ht 5' 3.75" (1.619 m)   Wt 197 lb 9 oz (89.6 kg)   SpO2 98%   BMI 34.18 kg/m?  ? ? ?Physical Exam ?Constitutional:   ?   General: She is not in acute distress. ?   Appearance: She is well-developed. She is not diaphoretic.  ?HENT:  ?   Right Ear: External ear normal.  ?   Left Ear: External ear normal.  ?   Nose: Nose normal.  ?Eyes:  ?   Conjunctiva/sclera: Conjunctivae normal.  ?Cardiovascular:  ?   Rate and Rhythm: Normal rate.  ?Pulmonary:  ?   Effort: Pulmonary effort is normal.  ?Musculoskeletal:  ?   Cervical back: Neck supple.  ?Skin: ?   General: Skin is warm and dry.  ?   Capillary Refill: Capillary refill takes less than 2 seconds.  ?Neurological:  ?   Mental Status: She is alert. Mental status is at baseline.  ?Psychiatric:     ?   Mood and Affect: Mood normal.     ?   Behavior: Behavior normal.  ? ? ? ? ? ?   ?Assessment & Plan:  ? ?Problem List Items Addressed This Visit   ? ?  ? Other  ? Prediabetes - Primary  ?  Hemoglobin A1c consistent with prediabetes.  Discussed option of starting  metformin to slow progression, and patient would like to do this.  Also briefly discussed Wegovy, she will consider in the future.  She is already doing healthy lifestyle with healthy diet and regular exercise.  Encouraged her to try to lose 5 to 10% of her body weight.  She will follow-up in 6 months at annual and we will recheck at that time. ? ?  ?  ? Relevant Medications  ? metFORMIN (GLUCOPHAGE) 500 MG tablet  ? ?Other Visit Diagnoses   ? ? Elevated blood sugar      ? Relevant Orders  ? POCT glycosylated hemoglobin (Hb A1C) (Completed)  ? ?  ? ? ? ?Return in about 5 months (around 08/05/2022) for annual. ? ?Lynnda Child, MD ? ? ? ?

## 2022-03-05 NOTE — Assessment & Plan Note (Signed)
Hemoglobin A1c consistent with prediabetes.  Discussed option of starting metformin to slow progression, and patient would like to do this.  Also briefly discussed Wegovy, she will consider in the future.  She is already doing healthy lifestyle with healthy diet and regular exercise.  Encouraged her to try to lose 5 to 10% of her body weight.  She will follow-up in 6 months at annual and we will recheck at that time. ?

## 2022-03-05 NOTE — Patient Instructions (Addendum)
?  I would like you to start a medication call Metformin.  ? ?I am prescribing a 500 mg tablet. The most common side effect is stomach upset (nausea and diarrhea). Below is a 4 week plan to increase. If you are experiencing side effects, do not move on to the next week unless your symptoms are better.  ? ?Week 1: Take 500 mg in the morning with food ?Week 2: Take 500 mg in the morning and the evening with food ? ? ?Check with insurance on Wegovy and Mounjaro - for Obesity and Prediabetes diagnosis coverage ? ?Your screen for diabetes shows that you have prediabetes. This means that you are at risk for developing diabetes.  ? ?You can slow the progression to diabetes through healthy diet and exercise.  ? ?Eat healthy foods ?Get at least 150 minutes of moderate aerobic physical activity a week, or about 30 minutes on most days of the week ?Lose excess weight ?Control your blood pressure and cholesterol ?Don't smoke ? ?We should repeat this test next year to monitor for changes.  ? ?

## 2022-03-07 NOTE — Addendum Note (Signed)
Addended by: Lynnda Child on: 03/07/2022 02:43 PM ? ? Modules accepted: Level of Service ? ?

## 2022-03-15 ENCOUNTER — Encounter: Payer: Self-pay | Admitting: Family Medicine

## 2022-03-19 ENCOUNTER — Other Ambulatory Visit: Payer: Self-pay | Admitting: Family Medicine

## 2022-03-22 ENCOUNTER — Other Ambulatory Visit: Payer: Self-pay

## 2022-03-27 ENCOUNTER — Telehealth: Payer: Self-pay

## 2022-03-27 ENCOUNTER — Encounter: Payer: Self-pay | Admitting: Family Medicine

## 2022-03-27 DIAGNOSIS — E669 Obesity, unspecified: Secondary | ICD-10-CM

## 2022-03-27 DIAGNOSIS — R7303 Prediabetes: Secondary | ICD-10-CM

## 2022-03-27 MED ORDER — WEGOVY 0.25 MG/0.5ML ~~LOC~~ SOAJ: 0.2500 mg | SUBCUTANEOUS | 0 refills | Status: DC

## 2022-03-27 NOTE — Telephone Encounter (Signed)
Prior auth approved for [employer] 0.25MG /0.5ML auto-injectors. Your prior authorization for Ann Cooper has been approved! Message from plan: Request Reference Number: [id number]. WEGOVY INJ 0.25MG  is approved through 10/27/2022. Your patient may now fill this prescription and it will be covered.

## 2022-03-27 NOTE — Telephone Encounter (Signed)
Prior auth started for [facility] 0.25MG /0.5ML auto-injectors. Ann Cooper (Key: [facility]) Rx #: [phone] Waiting for determination.

## 2022-04-05 ENCOUNTER — Encounter: Payer: Self-pay | Admitting: Family Medicine

## 2022-04-05 DIAGNOSIS — E669 Obesity, unspecified: Secondary | ICD-10-CM

## 2022-04-05 DIAGNOSIS — R7303 Prediabetes: Secondary | ICD-10-CM

## 2022-04-09 MED ORDER — WEGOVY 0.25 MG/0.5ML ~~LOC~~ SOAJ: 0.2500 mg | SUBCUTANEOUS | 0 refills | Status: DC

## 2022-04-09 NOTE — Addendum Note (Signed)
Addended by: Waunita Schooner R on: 04/09/2022 12:27 PM   Modules accepted: Orders

## 2022-04-10 ENCOUNTER — Telehealth: Payer: Self-pay

## 2022-04-18 NOTE — Telephone Encounter (Signed)
Created new message in error.

## 2022-05-09 ENCOUNTER — Other Ambulatory Visit: Payer: Self-pay | Admitting: Primary Care

## 2022-05-09 DIAGNOSIS — E66811 Obesity, class 1: Secondary | ICD-10-CM

## 2022-05-09 DIAGNOSIS — E669 Obesity, unspecified: Secondary | ICD-10-CM

## 2022-05-09 DIAGNOSIS — R7303 Prediabetes: Secondary | ICD-10-CM

## 2022-05-09 MED ORDER — WEGOVY 0.5 MG/0.5ML ~~LOC~~ SOAJ
0.5000 mg | SUBCUTANEOUS | 0 refills | Status: DC
  Filled 2022-05-16: qty 2, 28d supply, fill #0

## 2022-05-09 NOTE — Addendum Note (Signed)
Addended by: Doreene Nest on: 05/09/2022 05:03 PM   Modules accepted: Orders

## 2022-05-16 ENCOUNTER — Other Ambulatory Visit: Payer: Self-pay

## 2022-06-04 ENCOUNTER — Encounter: Payer: Self-pay | Admitting: Family Medicine

## 2022-06-04 DIAGNOSIS — E669 Obesity, unspecified: Secondary | ICD-10-CM

## 2022-06-05 ENCOUNTER — Other Ambulatory Visit: Payer: Self-pay

## 2022-06-05 MED ORDER — SEMAGLUTIDE-WEIGHT MANAGEMENT 1 MG/0.5ML ~~LOC~~ SOAJ
1.0000 mg | SUBCUTANEOUS | 0 refills | Status: DC
  Filled 2022-06-05: qty 2, 28d supply, fill #0

## 2022-06-13 LAB — LIPID PANEL
Cholesterol: 176 (ref 0–200)
HDL: 43 (ref 35–70)
LDL Cholesterol: 115
LDl/HDL Ratio: 4.1
Triglycerides: 100 (ref 40–160)

## 2022-06-13 LAB — BASIC METABOLIC PANEL: Glucose: 100

## 2022-06-13 LAB — HEMOGLOBIN A1C: Hemoglobin A1C: 5.3

## 2022-06-26 ENCOUNTER — Ambulatory Visit: Payer: Managed Care, Other (non HMO) | Admitting: Family Medicine

## 2022-06-26 ENCOUNTER — Encounter: Payer: Self-pay | Admitting: Family Medicine

## 2022-06-26 VITALS — BP 102/62 | HR 102 | Temp 97.5°F | Ht 63.75 in | Wt 178.2 lb

## 2022-06-26 DIAGNOSIS — E039 Hypothyroidism, unspecified: Secondary | ICD-10-CM | POA: Diagnosis not present

## 2022-06-26 DIAGNOSIS — N920 Excessive and frequent menstruation with regular cycle: Secondary | ICD-10-CM | POA: Diagnosis not present

## 2022-06-26 DIAGNOSIS — R7303 Prediabetes: Secondary | ICD-10-CM

## 2022-06-26 DIAGNOSIS — E669 Obesity, unspecified: Secondary | ICD-10-CM

## 2022-06-26 MED ORDER — SEMAGLUTIDE-WEIGHT MANAGEMENT 1 MG/0.5ML ~~LOC~~ SOAJ: 1.0000 mg | SUBCUTANEOUS | 0 refills | Status: DC

## 2022-06-26 NOTE — Assessment & Plan Note (Signed)
Using Nexplanon for birth control, discussed that this could be contributing to her heavy cycles.  At this time she is not experiencing severe cramps and symptoms are controlled with super tampons.  We will rule out changes in thyroid and evaluate for anemia.  Discussed she could try anti-inflammatory and/or consider OCPs in the future and/or follow-up with OB/GYN to remove Nexplanon.

## 2022-06-26 NOTE — Assessment & Plan Note (Signed)
Labs from work show improvement in hemoglobin A1c on Wegovy.  Continue healthy diet.

## 2022-06-26 NOTE — Assessment & Plan Note (Addendum)
Weight is down 20 pounds, at this point we will continue 1 mg dose of Wegovy, she will update in 1 month to let me know if she wants to try higher dose.

## 2022-06-26 NOTE — Patient Instructions (Signed)
Update in 1 month with how you are doing and whether you want to change doses

## 2022-06-26 NOTE — Assessment & Plan Note (Signed)
Repeat thyroid, continue levo 50 mcg pending labs.

## 2022-06-26 NOTE — Progress Notes (Signed)
Subjective:     Ann Cooper is a 36 y.o. female presenting for Follow-up (weight)     HPI  #weight loss - is feeling constipated - drinking water to help with this - taking ozempic 1 mg - has tolerated dose increases - taking Saturday   #Heavy cycles - is having to use the super plus in the beginning and changing every 3-4 hours - was going through a regular in 2 hours - just the last 2 months - cramps are not bad - will have 3 heavy days - also lasting >7 days - is on the nexplanon - before it was irregular  Review of Systems   Social History   Tobacco Use  Smoking Status Never  Smokeless Tobacco Never        Objective:    BP Readings from Last 3 Encounters:  06/26/22 102/62  03/05/22 100/70  11/05/21 117/81   Wt Readings from Last 3 Encounters:  06/26/22 178 lb 4 oz (80.9 kg)  03/05/22 197 lb 9 oz (89.6 kg)  07/13/21 197 lb (89.4 kg)    BP 102/62   Pulse (!) 102   Temp (!) 97.5 F (36.4 C) (Temporal)   Ht 5' 3.75" (1.619 m)   Wt 178 lb 4 oz (80.9 kg)   SpO2 97%   BMI 30.84 kg/m    Physical Exam Constitutional:      General: She is not in acute distress.    Appearance: She is well-developed. She is not diaphoretic.  HENT:     Right Ear: External ear normal.     Left Ear: External ear normal.     Nose: Nose normal.  Eyes:     Conjunctiva/sclera: Conjunctivae normal.  Cardiovascular:     Rate and Rhythm: Normal rate and regular rhythm.  Pulmonary:     Effort: Pulmonary effort is normal. No respiratory distress.     Breath sounds: Normal breath sounds. No wheezing.  Abdominal:     General: Abdomen is flat. There is no distension.     Palpations: Abdomen is soft.     Tenderness: There is no abdominal tenderness. There is no guarding or rebound.  Musculoskeletal:     Cervical back: Neck supple.  Skin:    General: Skin is warm and dry.     Capillary Refill: Capillary refill takes less than 2 seconds.  Neurological:     Mental  Status: She is alert. Mental status is at baseline.  Psychiatric:        Mood and Affect: Mood normal.        Behavior: Behavior normal.           Assessment & Plan:   Problem List Items Addressed This Visit       Endocrine   Hypothyroidism - Primary    Repeat thyroid, continue levo 50 mcg pending labs.      Relevant Orders   TSH   Comprehensive metabolic panel     Other   Obesity (BMI 30.0-34.9)    Weight is down 20 pounds, at this point we will continue 1 mg dose of Wegovy, she will update in 1 month to let me know if she wants to try higher dose.      Relevant Medications   Semaglutide-Weight Management 1 MG/0.5ML SOAJ   Other Relevant Orders   Comprehensive metabolic panel   Prediabetes    Labs from work show improvement in hemoglobin A1c on Wegovy.  Continue healthy diet.  Menorrhagia with regular cycle    Using Nexplanon for birth control, discussed that this could be contributing to her heavy cycles.  At this time she is not experiencing severe cramps and symptoms are controlled with super tampons.  We will rule out changes in thyroid and evaluate for anemia.  Discussed she could try anti-inflammatory and/or consider OCPs in the future and/or follow-up with OB/GYN to remove Nexplanon.      Relevant Orders   CBC   Ferritin   Comprehensive metabolic panel     Return in about 2 months (around 08/26/2022) for TOC with Tabitha - weight check.  Lynnda Child, MD

## 2022-06-27 ENCOUNTER — Encounter: Payer: Self-pay | Admitting: Family

## 2022-06-27 LAB — CBC
Hematocrit: 43.7 % (ref 34.0–46.6)
Hemoglobin: 15 g/dL (ref 11.1–15.9)
MCH: 31.2 pg (ref 26.6–33.0)
MCHC: 34.3 g/dL (ref 31.5–35.7)
MCV: 91 fL (ref 79–97)
Platelets: 228 10*3/uL (ref 150–450)
RBC: 4.81 x10E6/uL (ref 3.77–5.28)
RDW: 11.9 % (ref 11.7–15.4)
WBC: 5 10*3/uL (ref 3.4–10.8)

## 2022-06-27 LAB — COMPREHENSIVE METABOLIC PANEL
ALT: 19 IU/L (ref 0–32)
AST: 14 IU/L (ref 0–40)
Albumin/Globulin Ratio: 1.8 (ref 1.2–2.2)
Albumin: 4.8 g/dL (ref 3.9–4.9)
Alkaline Phosphatase: 63 IU/L (ref 44–121)
BUN/Creatinine Ratio: 19 (ref 9–23)
BUN: 17 mg/dL (ref 6–20)
Bilirubin Total: 0.6 mg/dL (ref 0.0–1.2)
CO2: 22 mmol/L (ref 20–29)
Calcium: 9.5 mg/dL (ref 8.7–10.2)
Chloride: 104 mmol/L (ref 96–106)
Creatinine, Ser: 0.9 mg/dL (ref 0.57–1.00)
Globulin, Total: 2.7 g/dL (ref 1.5–4.5)
Glucose: 97 mg/dL (ref 70–99)
Potassium: 4.5 mmol/L (ref 3.5–5.2)
Sodium: 141 mmol/L (ref 134–144)
Total Protein: 7.5 g/dL (ref 6.0–8.5)
eGFR: 84 mL/min/{1.73_m2} (ref >59)

## 2022-06-27 LAB — TSH: TSH: 1.65 u[IU]/mL (ref 0.450–4.500)

## 2022-06-27 LAB — FERRITIN: Ferritin: 118 ng/mL (ref 15–150)

## 2022-07-03 ENCOUNTER — Encounter: Payer: Self-pay | Admitting: Family Medicine

## 2022-07-03 DIAGNOSIS — E669 Obesity, unspecified: Secondary | ICD-10-CM

## 2022-07-03 DIAGNOSIS — R7303 Prediabetes: Secondary | ICD-10-CM

## 2022-07-04 MED ORDER — SEMAGLUTIDE-WEIGHT MANAGEMENT 1.7 MG/0.75ML ~~LOC~~ SOAJ: 1.7000 mg | SUBCUTANEOUS | 0 refills | Status: DC

## 2022-07-04 NOTE — Addendum Note (Signed)
Addended by: Lynnda Child on: 07/04/2022 09:49 AM   Modules accepted: Orders

## 2022-08-06 ENCOUNTER — Encounter: Payer: Self-pay | Admitting: Family Medicine

## 2022-08-06 DIAGNOSIS — E669 Obesity, unspecified: Secondary | ICD-10-CM

## 2022-08-06 DIAGNOSIS — Z9189 Other specified personal risk factors, not elsewhere classified: Secondary | ICD-10-CM

## 2022-08-06 DIAGNOSIS — R7303 Prediabetes: Secondary | ICD-10-CM

## 2022-08-09 MED ORDER — WEGOVY 2.4 MG/0.75ML ~~LOC~~ SOAJ: 2.4000 mg | SUBCUTANEOUS | 2 refills | Status: DC

## 2022-08-09 NOTE — Telephone Encounter (Signed)
Looks like she has toc appointment with you at the end of the month do I call the refill in under you or webb?

## 2022-08-27 ENCOUNTER — Encounter: Payer: Managed Care, Other (non HMO) | Admitting: Family

## 2022-09-06 ENCOUNTER — Encounter: Payer: Self-pay | Admitting: Family

## 2022-09-06 DIAGNOSIS — R7303 Prediabetes: Secondary | ICD-10-CM

## 2022-09-06 DIAGNOSIS — Z9189 Other specified personal risk factors, not elsewhere classified: Secondary | ICD-10-CM

## 2022-09-06 DIAGNOSIS — E669 Obesity, unspecified: Secondary | ICD-10-CM

## 2022-09-06 MED ORDER — WEGOVY 2.4 MG/0.75ML ~~LOC~~ SOAJ: 2.4000 mg | SUBCUTANEOUS | 2 refills | Status: DC

## 2022-09-10 ENCOUNTER — Other Ambulatory Visit (HOSPITAL_COMMUNITY): Payer: Self-pay

## 2022-10-02 MED ORDER — WEGOVY 2.4 MG/0.75ML ~~LOC~~ SOAJ: 2.4000 mg | SUBCUTANEOUS | 2 refills | Status: DC

## 2022-10-02 NOTE — Addendum Note (Signed)
Addended by: Mort Sawyers on: 10/02/2022 08:41 PM   Modules accepted: Orders

## 2022-11-02 ENCOUNTER — Encounter: Payer: Self-pay | Admitting: Family

## 2022-11-02 DIAGNOSIS — R7303 Prediabetes: Secondary | ICD-10-CM

## 2022-11-02 DIAGNOSIS — Z9189 Other specified personal risk factors, not elsewhere classified: Secondary | ICD-10-CM

## 2022-11-02 DIAGNOSIS — E669 Obesity, unspecified: Secondary | ICD-10-CM

## 2022-11-02 MED ORDER — WEGOVY 2.4 MG/0.75ML ~~LOC~~ SOAJ: 2.4000 mg | SUBCUTANEOUS | 2 refills | Status: DC

## 2022-11-05 NOTE — Telephone Encounter (Signed)
Have we seen any p/a requests come over on this pt for wegovy?

## 2022-11-05 NOTE — Telephone Encounter (Signed)
This patient needs a PA completed on Wegovy. Thanks!

## 2022-11-06 ENCOUNTER — Telehealth: Payer: Self-pay

## 2022-11-06 ENCOUNTER — Other Ambulatory Visit (HOSPITAL_COMMUNITY): Payer: Self-pay

## 2022-11-06 NOTE — Telephone Encounter (Signed)
Patient Advocate Encounter  Prior Authorization for [employer] 2.4MG /0.75ML auto-injectors has been approved.    PA# [id number] Effective dates: 11/06/22 through 05/07/23

## 2022-11-06 NOTE — Telephone Encounter (Signed)
Pharmacy Patient Advocate Encounter   Received notification from [facility] that prior authorization for [facility] 2.4MG /0.75ML auto-injectors is required/requested.   PA submitted on 11/06/22 to (ins) [facility] via CoverMyMeds Key [id number] Status is pending

## 2022-11-06 NOTE — Telephone Encounter (Signed)
Patient notified through MyChart message.

## 2022-11-14 ENCOUNTER — Encounter: Payer: Self-pay | Admitting: Family

## 2022-11-14 ENCOUNTER — Ambulatory Visit: Payer: Managed Care, Other (non HMO) | Admitting: Family

## 2022-11-14 VITALS — BP 118/76 | HR 100 | Temp 98.6°F | Ht 64.0 in | Wt 157.8 lb

## 2022-11-14 DIAGNOSIS — N920 Excessive and frequent menstruation with regular cycle: Secondary | ICD-10-CM

## 2022-11-14 DIAGNOSIS — E039 Hypothyroidism, unspecified: Secondary | ICD-10-CM

## 2022-11-14 DIAGNOSIS — Z23 Encounter for immunization: Secondary | ICD-10-CM

## 2022-11-14 DIAGNOSIS — Z1159 Encounter for screening for other viral diseases: Secondary | ICD-10-CM | POA: Insufficient documentation

## 2022-11-14 DIAGNOSIS — M5412 Radiculopathy, cervical region: Secondary | ICD-10-CM | POA: Diagnosis not present

## 2022-11-14 DIAGNOSIS — E663 Overweight: Secondary | ICD-10-CM | POA: Insufficient documentation

## 2022-11-14 MED ORDER — LEVOTHYROXINE SODIUM 50 MCG PO TABS: 50.0000 ug | ORAL_TABLET | Freq: Every day | ORAL | 1 refills | Status: DC

## 2022-11-14 MED ORDER — TIZANIDINE HCL 2 MG PO CAPS: 2.0000 mg | ORAL_CAPSULE | Freq: Three times a day (TID) | ORAL | Status: DC | PRN

## 2022-11-14 NOTE — Progress Notes (Signed)
Established Patient Office Visit  Subjective:  Patient ID: Ann Cooper, female    DOB: April 25, 1986  Age: 37 y.o. MRN: [id number]  CC:  Chief Complaint  Patient presents with   Establish Care    HPI Ann Cooper is here for a transition of care visit.  Prior provider was:Dr. Gweneth Dimitri  Pt is without acute concerns.   Wt Readings from Last 3 Encounters:  11/14/22 157 lb 12.8 oz (71.6 kg)  06/26/22 178 lb 4 oz (80.9 kg)  03/05/22 197 lb 9 oz (89.6 kg)    chronic concerns:  Hypothyroid: without her levothyroxine for about once month.   Obesity: has lost 40 pounds with this, and curious if there is a maintenance weight. Still losing about one pound a week. She is currently on 2.4 mg weekly of wegovy. Denies constipation, no longer with n/v. She tries to eat healthy at home,increased her protein as well. She tries to work on a diabetic diet and had diabetes when was pregnant. She chases her two year old at home, does want to start lifting weights.  Wt Readings from Last 3 Encounters:  11/14/22 157 lb 12.8 oz (71.6 kg)  06/26/22 178 lb 4 oz (80.9 kg)  03/05/22 197 lb 9 oz (89.6 kg)   Nexplanon in place: [facility].   Past Medical History:  Diagnosis Date   Allergy    Depression    Gestational diabetes    History of chicken pox    History of hypothyroidism    Thyroid disease     Past Surgical History:  Procedure Laterality Date   NPFL left knee  02/2011   and menscus repair    Family History  Problem Relation Age of Onset   Arthritis Mother    Asthma Mother    Depression Mother    Miscarriages / [place] Mother    Asthma Sister    Depression Sister    Arthritis Maternal Grandmother    Asthma Maternal Grandmother    Depression Maternal Grandmother    Scoliosis Maternal Grandmother    Alcohol abuse Maternal Grandfather    Diabetes Maternal Grandfather     Social History   Socioeconomic History   Marital status: Married    Spouse  name: Onalee Hua   Number of children: Not on file   Years of education: Bachelors degree   Highest education level: Not on file  Occupational History   Not on file  Tobacco Use   Smoking status: Never   Smokeless tobacco: Never  Vaping Use   Vaping Use: Never used  Substance and Sexual Activity   Alcohol use: Not Currently    Comment: once a month   Drug use: Never   Sexual activity: Yes    Partners: Male    Birth control/protection: None  Other Topics Concern   Not on file  Social History Narrative   Lives with Onalee Hua      Pets: dog, cat, and bird   Children: Maisie Fus, 2 y/o  (2023)   Works at [employer]   Enjoys: walks with the dog, watching sports, puzzles   Exercise: started back at the [employer] - 5 times a week, walking on the treadmill 3.2 miles   Diet: pretty healthy, whole foods delivery - low carbs, meat/fruit/veggies      Biological dad was sperm donor, 'dad' was not able to have children himself.    Social Determinants of Health   Financial Resource Strain: Low Risk  (12/16/2018)  Overall Emergency planning/management officer Strain (CARDIA)    Difficulty of Paying Living Expenses: Not hard at all  Food Insecurity: Not on file  Transportation Needs: Not on file  Physical Activity: Not on file  Stress: Not on file  Social Connections: Not on file  Intimate Partner Violence: Not on file    Outpatient Medications Prior to Visit  Medication Sig Dispense Refill   etonogestrel (NEXPLANON) 68 MG IMPL implant 1 each by Subdermal route once.     fluticasone (FLONASE) 50 MCG/ACT nasal spray PLACE 1 SPRAY INTO BOTH NOSTRILS DAILY AS NEEDED. 16 mL 1   Semaglutide-Weight Management (WEGOVY) 2.4 MG/0.75ML SOAJ Inject 2.4 mg into the skin once a week. 3 mL 2   levothyroxine (SYNTHROID) 50 MCG tablet Take 1 tablet (50 mcg total) by mouth daily. (Patient not taking: Reported on 11/14/2022) 90 tablet 1   No facility-administered medications prior to visit.    Allergies  Allergen Reactions   Red Dye  Nausea And Vomiting   Tree Extract     Tree nuts- ear and throat gets itchy.     ROS Review of Systems  Systems negative unless otherwise addressed in HPI     Objective:    Physical Exam  Gen: NAD, resting comfortably CV: RRR with no murmurs appreciated Pulm: NWOB, CTAB with no crackles, wheezes, or rhonchi Skin: warm, dry Psych: Normal affect and thought content  BP 118/76   Pulse 100   Temp 98.6 F (37 C) (Oral)   Ht 5\' 4"  (1.626 m)   Wt 157 lb 12.8 oz (71.6 kg)   LMP 11/03/2022   SpO2 99%   BMI 27.09 kg/m  Wt Readings from Last 3 Encounters:  11/14/22 157 lb 12.8 oz (71.6 kg)  06/26/22 178 lb 4 oz (80.9 kg)  03/05/22 197 lb 9 oz (89.6 kg)     Health Maintenance Due  Topic Date Due   Hepatitis C Screening  Never done    There are no preventive care reminders to display for this patient.  Lab Results  Component Value Date   TSH 1.650 06/26/2022   Lab Results  Component Value Date   WBC 5.0 06/26/2022   HGB 15.0 06/26/2022   HCT 43.7 06/26/2022   MCV 91 06/26/2022   PLT 228 06/26/2022   Lab Results  Component Value Date   NA 141 06/26/2022   K 4.5 06/26/2022   CO2 22 06/26/2022   GLUCOSE 97 06/26/2022   BUN 17 06/26/2022   CREATININE 0.90 06/26/2022   BILITOT 0.6 06/26/2022   ALKPHOS 63 06/26/2022   AST 14 06/26/2022   ALT 19 06/26/2022   PROT 7.5 06/26/2022   ALBUMIN 4.8 06/26/2022   CALCIUM 9.5 06/26/2022   EGFR 84 06/26/2022   Lab Results  Component Value Date   CHOL 176 06/13/2022   Lab Results  Component Value Date   HDL 43 06/13/2022   Lab Results  Component Value Date   LDLCALC 115 06/13/2022   Lab Results  Component Value Date   TRIG 100 06/13/2022   Lab Results  Component Value Date   CHOLHDL 4.2 07/13/2021   Lab Results  Component Value Date   HGBA1C 5.3 06/13/2022      Assessment & Plan:   Hypothyroidism, unspecified type Assessment & Plan: Non-complaint.  Will recheck tsh today. Pending results.  Rx  refill sent in for levothyroxine 50 mcg once daily.    Orders: -     TSH -  Levothyroxine Sodium; Take 1 tablet (50 mcg total) by mouth daily.  Dispense: 90 tablet; Refill: 1  Overweight (BMI 25.0-29.9)  Menorrhagia with regular cycle Assessment & Plan: Controlled with nexplanon and was on two month ocps which helped her. She has f/u with her gyn in the next few months.    Cervical radiculopathy Assessment & Plan: Stable, intermittent. Not at current.  Muscle relaxers prn.   Orders: -     tiZANidine HCl; Take 1 capsule (2 mg total) by mouth 3 (three) times daily as needed for muscle spasms.  Encounter for hepatitis C screening test for low risk patient -     Hepatitis C antibody      Meds ordered this encounter  Medications   levothyroxine (SYNTHROID) 50 MCG tablet    Sig: Take 1 tablet (50 mcg total) by mouth daily.    Dispense:  90 tablet    Refill:  1    Order Specific Question:   Supervising Provider    Answer:   BEDSOLE, AMY E [[id number]]   tizanidine (ZANAFLEX) 2 MG capsule    Sig: Take 1 capsule (2 mg total) by mouth 3 (three) times daily as needed for muscle spasms.    Order Specific Question:   Supervising Provider    Answer:   Eliezer Lofts E [[id number]]    Follow-up: Return in about 6 months (around 05/15/2023) for f/u CPE schedule after 06/27/23.    Eugenia Pancoast, FNP

## 2022-11-14 NOTE — Assessment & Plan Note (Signed)
Stable, intermittent. Not at current.  Muscle relaxers prn.

## 2022-11-14 NOTE — Patient Instructions (Signed)
  recommend debrox over the counter for when ears feel full.   Welcome to our clinic, I am happy to have you as my new patient. I am excited to continue on this healthcare journey with you.  ------------------------------------  Stop by the lab prior to leaving today. I will notify you of your results once received.   Please keep in mind Any my chart messages you send have up to a three business day turnaround for a response.  Phone calls may take up to a one full business day turnaround for a  response.   If you need a medication refill I recommend you request it through the pharmacy as this is easiest for [place] rather than sending a message and or phone call.   Due to recent changes in healthcare laws, you may see results of your imaging and/or laboratory studies on MyChart before I have had a chance to review them.  I understand that in some cases there may be results that are confusing or concerning to you. Please understand that not all results are received at the same time and often I may need to interpret multiple results in order to provide you with the best plan of care or course of treatment. Therefore, I ask that you please give me 2 business days to thoroughly review all your results before contacting my office for clarification. Should we see a critical lab result, you will be contacted sooner.   It was a pleasure seeing you today! Please do not hesitate to reach out with any questions and or concerns.  Regards,   Eugenia Pancoast FNP-C

## 2022-11-14 NOTE — Assessment & Plan Note (Signed)
Non-complaint.  Will recheck tsh today. Pending results.  Rx refill sent in for levothyroxine 50 mcg once daily.

## 2022-11-14 NOTE — Assessment & Plan Note (Signed)
Controlled with nexplanon and was on two month ocps which helped her. She has f/u with her gyn in the next few months.

## 2022-11-15 ENCOUNTER — Encounter: Payer: Self-pay | Admitting: Family

## 2022-11-15 DIAGNOSIS — J0110 Acute frontal sinusitis, unspecified: Secondary | ICD-10-CM

## 2022-11-15 LAB — HEPATITIS C ANTIBODY: Hep C Virus Ab: NONREACTIVE

## 2022-11-15 LAB — TSH: TSH: 2.25 u[IU]/mL (ref 0.450–4.500)

## 2022-11-15 NOTE — Telephone Encounter (Signed)
Did you go over this in her visit or do you need [place] to call to get her set up appointment.

## 2022-11-16 MED ORDER — DOXYCYCLINE HYCLATE 100 MG PO TABS: 100.0000 mg | ORAL_TABLET | Freq: Two times a day (BID) | ORAL | 0 refills | Status: AC

## 2022-11-16 NOTE — Addendum Note (Signed)
Addended by: Eugenia Pancoast on: 11/16/2022 03:17 PM   Modules accepted: Orders

## 2022-11-27 ENCOUNTER — Encounter: Payer: Self-pay | Admitting: Family

## 2023-02-18 ENCOUNTER — Other Ambulatory Visit: Payer: Self-pay | Admitting: Family

## 2023-02-18 DIAGNOSIS — M5412 Radiculopathy, cervical region: Secondary | ICD-10-CM

## 2023-02-18 MED ORDER — TIZANIDINE HCL 2 MG PO CAPS: 2.0000 mg | ORAL_CAPSULE | Freq: Three times a day (TID) | ORAL | Status: DC | PRN

## 2023-02-19 ENCOUNTER — Other Ambulatory Visit (HOSPITAL_BASED_OUTPATIENT_CLINIC_OR_DEPARTMENT_OTHER): Payer: Self-pay

## 2023-02-19 ENCOUNTER — Encounter: Payer: Self-pay | Admitting: Family

## 2023-02-19 DIAGNOSIS — M5412 Radiculopathy, cervical region: Secondary | ICD-10-CM

## 2023-02-19 MED ORDER — TIZANIDINE HCL 2 MG PO CAPS: 2.0000 mg | ORAL_CAPSULE | Freq: Three times a day (TID) | ORAL | 2 refills | Status: DC | PRN

## 2023-03-28 ENCOUNTER — Encounter: Payer: Self-pay | Admitting: Family

## 2023-04-03 ENCOUNTER — Other Ambulatory Visit: Payer: Self-pay | Admitting: *Deleted

## 2023-04-03 ENCOUNTER — Encounter: Payer: Self-pay | Admitting: Family

## 2023-04-03 ENCOUNTER — Ambulatory Visit: Payer: Managed Care, Other (non HMO) | Admitting: Family

## 2023-04-03 VITALS — BP 124/84 | HR 94 | Temp 97.8°F | Ht 64.0 in | Wt 146.0 lb

## 2023-04-03 DIAGNOSIS — R195 Other fecal abnormalities: Secondary | ICD-10-CM | POA: Diagnosis not present

## 2023-04-03 DIAGNOSIS — R7303 Prediabetes: Secondary | ICD-10-CM

## 2023-04-03 DIAGNOSIS — Z6825 Body mass index (BMI) 25.0-25.9, adult: Secondary | ICD-10-CM

## 2023-04-03 DIAGNOSIS — Z9189 Other specified personal risk factors, not elsewhere classified: Secondary | ICD-10-CM | POA: Diagnosis not present

## 2023-04-03 DIAGNOSIS — E039 Hypothyroidism, unspecified: Secondary | ICD-10-CM

## 2023-04-03 DIAGNOSIS — E663 Overweight: Secondary | ICD-10-CM

## 2023-04-03 MED ORDER — WEGOVY 2.4 MG/0.75ML ~~LOC~~ SOAJ: 2.4000 mg | SUBCUTANEOUS | 1 refills | Status: DC

## 2023-04-03 NOTE — Assessment & Plan Note (Signed)
Repeat fobt today  If positive will refer to GI for evaluation.

## 2023-04-03 NOTE — Assessment & Plan Note (Signed)
Stable Continue levothyroxine 50 mcg once daily repeat tsh next visit.

## 2023-04-03 NOTE — Progress Notes (Signed)
Established Patient Office Visit  Subjective:   Patient ID: Ann Cooper, female    DOB: 11-Mar-1986  Age: 37 y.o. MRN: [id number]  CC:  Chief Complaint  Patient presents with   Nasal Congestion    Off and on since December, but has resolved with Flonase.   Referral    At home Fecal Occult test was positive.    HPI: Ann Cooper is a 37 y.o. female presenting on 04/03/2023 for Nasal Congestion (Off and on since December, but has resolved with Flonase.) and Referral (At home Fecal Occult test was positive.)   HPI  Pt states that she took an at home FOBT test and she states it was a positive result. She states that her job offered an FOBT test so this is why she tested for it. She does state that her mom has had polyps removed, and her MGF had colon cancer. Denies known hemorrhoids and or blood in stool that she has seen. She has not seen blood in the stool.   Nasal congestion: has resolved now since last visit and she states much improved since starting flonase. She had also been using afrin as well   No longer overweight, doing well on wegovy . She has been at her stable weight, feeling much more comfortable in her clothes.   Lab Results  Component Value Date   TSH 2.250 11/14/2022      ROS: Negative unless specifically indicated above in HPI.   Relevant past medical history reviewed and updated as indicated.   Allergies and medications reviewed and updated.   Current Outpatient Medications:    fluticasone (FLONASE) 50 MCG/ACT nasal spray, PLACE 1 SPRAY INTO BOTH NOSTRILS DAILY AS NEEDED., Disp: 16 mL, Rfl: 1   Levonorgestrel-Ethinyl Estradiol (AMETHIA) 0.1-0.02 & 0.01 MG tablet, Take 1 tablet by mouth daily., Disp: , Rfl:    levothyroxine (SYNTHROID) 50 MCG tablet, Take 1 tablet (50 mcg total) by mouth daily., Disp: 90 tablet, Rfl: 1   tizanidine (ZANAFLEX) 2 MG capsule, Take 1 capsule (2 mg total) by mouth 3 (three) times daily as needed for muscle spasms., Disp: 30  capsule, Rfl: 2   Semaglutide-Weight Management (WEGOVY) 2.4 MG/0.75ML SOAJ, Inject 2.4 mg into the skin once a week., Disp: 9 mL, Rfl: 1  Allergies  Allergen Reactions   Red Dye Nausea And Vomiting   Tree Extract     Tree nuts- ear and throat gets itchy.     Objective:   BP 124/84   Pulse 94   Temp 97.8 F (36.6 C) (Temporal)   Ht 5\' 4"  (1.626 m)   Wt 146 lb (66.2 kg)   LMP 03/30/2023   SpO2 99%   BMI 25.06 kg/m    Physical Exam Constitutional:      General: She is not in acute distress.    Appearance: Normal appearance. She is normal weight. She is not ill-appearing, toxic-appearing or diaphoretic.  HENT:     Head: Normocephalic.  Cardiovascular:     Rate and Rhythm: Normal rate.  Pulmonary:     Effort: Pulmonary effort is normal.  Musculoskeletal:        General: Normal range of motion.  Neurological:     General: No focal deficit present.     Mental Status: She is alert and oriented to person, place, and time. Mental status is at baseline.  Psychiatric:        Mood and Affect: Mood normal.        Behavior: Behavior  normal.        Thought Content: Thought content normal.        Judgment: Judgment normal.       Assessment & Plan:  Positive fecal occult blood test Assessment & Plan: Repeat fobt today  If positive will refer to GI for evaluation.    Orders: -     Fecal occult blood, imunochemical; Future  Prediabetes -     MJ.Hock; Inject 2.4 mg into the skin once a week.  Dispense: 9 mL; Refill: 1  At risk for diabetes mellitus -     Wegovy; Inject 2.4 mg into the skin once a week.  Dispense: 9 mL; Refill: 1  Acquired hypothyroidism Assessment & Plan: Stable Continue levothyroxine 50 mcg once daily repeat tsh next visit.   Overweight (BMI 25.0-29.9) -     ZOXWRU; Inject 2.4 mg into the skin once a week.  Dispense: 9 mL; Refill: 1     Follow up plan: Return for as scheduled 8/24.  Mort Sawyers, FNP

## 2023-04-10 ENCOUNTER — Other Ambulatory Visit: Payer: Self-pay | Admitting: Family

## 2023-04-10 DIAGNOSIS — R195 Other fecal abnormalities: Secondary | ICD-10-CM

## 2023-04-10 LAB — FECAL OCCULT BLOOD, IMMUNOCHEMICAL: Fecal Occult Bld: POSITIVE — AB

## 2023-04-11 ENCOUNTER — Telehealth: Payer: Self-pay

## 2023-04-11 ENCOUNTER — Other Ambulatory Visit (HOSPITAL_COMMUNITY): Payer: Self-pay

## 2023-04-11 NOTE — Telephone Encounter (Signed)
My chart sent to patient to let know.  

## 2023-04-11 NOTE — Telephone Encounter (Signed)
Pharmacy Patient Advocate Encounter   Received notification from [facility] that prior authorization for [facility] 2.4mg  is set to expire soon.   PA submitted to [facility] via CoverMyMeds Key  # [id number]  Prior Authorization has been approved.  PA# [id number] Effective dates:  through 12/34/2024  Per [facility] test claim, copay for 28 days supply is $refill too soon. Can be fill on or after 04/20/23.

## 2023-05-21 NOTE — Progress Notes (Unsigned)
Ann Amy, PA-C [address]  [address]  [place], [place] [address]  Main: [phone]  Fax: [phone]   Gastroenterology Consultation  Referring Provider:     Mort Sawyers, FNP Primary Care Physician:  Ann Sawyers, FNP Primary Gastroenterologist:  Ann Amy, PA-C / Dr. Lannette Cooper   Reason for Consultation:     Positive FOBT        HPI:   Ann Cooper is a 37 y.o. y/o female referred for consultation & management  by Ann Sawyers, FNP.    Patient states she took an at-home FOBT Test and it was positive.  FOBT Test was offered by her job.  Patient has not seen any blood in her stool.  Currently on Wegovy which has helped with weight loss.  He had mild constipation initially, however no current or recent constipation.  Having normal regular bowel movements.  She denies rectal bleeding or history of hemorrhoids.  She has had episode of diarrhea after immediate eating milk or dairy products.  She avoids milk and dairy and not has not had any recent diarrhea episodes.  She followed up with her PCP who ordered a fit test which was positive.  Was recommended to follow-up with GI.  Last CBC 05/2022 showed hemoglobin 15.0.  No recent hemoglobin.  Has family history of mother who had colon polyps and maternal grandfather who had colon cancer.  Patient has never had a colonoscopy.  Past Medical History:  Diagnosis Date   Allergy    Depression    Gestational diabetes    History of chicken pox    History of hypothyroidism    Thyroid disease     Past Surgical History:  Procedure Laterality Date   NPFL left knee  02/2011   and menscus repair    Prior to Admission medications   Medication Sig Start Date End Date Taking? Authorizing Provider  fluticasone (FLONASE) 50 MCG/ACT nasal spray PLACE 1 SPRAY INTO BOTH NOSTRILS DAILY AS NEEDED. 03/20/22   Gweneth Dimitri, MD  Levonorgestrel-Ethinyl Estradiol (AMETHIA) 0.1-0.02 & 0.01 MG tablet Take 1 tablet by mouth daily.     [provider]  levothyroxine (SYNTHROID) 50 MCG tablet Take 1 tablet (50 mcg total) by mouth daily. 11/14/22 11/14/23  Ann Sawyers, FNP  Semaglutide-Weight Management (WEGOVY) 2.4 MG/0.75ML SOAJ Inject 2.4 mg into the skin once a week. 04/03/23   Ann Sawyers, FNP  tizanidine (ZANAFLEX) 2 MG capsule Take 1 capsule (2 mg total) by mouth 3 (three) times daily as needed for muscle spasms. 02/19/23   Ann Sawyers, FNP    Family History  Problem Relation Age of Onset   Arthritis Mother    Asthma Mother    Depression Mother    Miscarriages / [place] Mother    Asthma Sister    Depression Sister    Arthritis Maternal Grandmother    Asthma Maternal Grandmother    Depression Maternal Grandmother    Scoliosis Maternal Grandmother    Alcohol abuse Maternal Grandfather    Diabetes Maternal Grandfather    Colon cancer Maternal Grandfather      Social History   Tobacco Use   Smoking status: Never   Smokeless tobacco: Never  Vaping Use   Vaping status: Never Used  Substance Use Topics   Alcohol use: Not Currently    Comment: once a month   Drug use: Never    Allergies as of 05/22/2023 - Review Complete 05/22/2023  Allergen Reaction Noted   Red dye Nausea  And Vomiting 09/30/2020   Tree extract  12/16/2018    Review of Systems:    All systems reviewed and negative except where noted in HPI.   Physical Exam:  BP 113/79   Pulse (!) 102   Temp 98.4 F (36.9 C)   Ht 5\' 4"  (1.626 m)   Wt 141 lb 12.8 oz (64.3 kg)   LMP 03/30/2023   BMI 24.34 kg/m  Patient's last menstrual period was 03/30/2023. Psych:  Alert and cooperative. Normal mood and affect. General:   Alert,  Well-developed, well-nourished, pleasant and cooperative in NAD Lungs:  Respirations even and unlabored.  Clear throughout to auscultation.   No wheezes, crackles, or rhonchi. No acute distress. Heart:  Regular rate and rhythm; no murmurs, clicks, rubs, or gallops. Abdomen:  Normal bowel sounds.  No  bruits.  Soft, and non-distended without masses, hepatosplenomegaly or hernias noted.  No Tenderness.  No guarding or rebound tenderness.    Neurologic:  Alert and oriented x3;  grossly normal neurologically. Psych:  Alert and cooperative. Normal mood and affect.  Imaging Studies: No results found.  Assessment and Plan:   Ann Cooper is a 37 y.o. y/o female has been referred for positive Hemoccult test for blood in stool.  She has family history of maternal grandfather who had colon cancer in mother who had colon polyps.  Patient has never had a colonoscopy.  1.  Positive FOBT and FIT test 2.  Family history of colon cancer (MGF) and colon polyps (Mother)  Lab CBC  Scheduling Colonoscopy I discussed risks of colonoscopy with patient to include risk of bleeding, colon perforation, and risk of sedation.  Patient expressed understanding and agrees to proceed with colonoscopy.   Follow up PRN based on colonoscopy results and GI symptoms.  Ann Amy, PA-C

## 2023-05-22 ENCOUNTER — Ambulatory Visit: Payer: Managed Care, Other (non HMO) | Admitting: Physician Assistant

## 2023-05-22 ENCOUNTER — Encounter: Payer: Self-pay | Admitting: Physician Assistant

## 2023-05-22 VITALS — BP 113/79 | HR 102 | Temp 98.4°F | Ht 64.0 in | Wt 141.8 lb

## 2023-05-22 DIAGNOSIS — Z83719 Family history of colon polyps, unspecified: Secondary | ICD-10-CM

## 2023-05-22 DIAGNOSIS — Z800 Family history of malignant neoplasm of digestive organs: Secondary | ICD-10-CM | POA: Diagnosis not present

## 2023-05-22 DIAGNOSIS — R195 Other fecal abnormalities: Secondary | ICD-10-CM | POA: Diagnosis not present

## 2023-05-22 MED ORDER — PEG 3350-KCL-NA BICARB-NACL 420 G PO SOLR: 4000.0000 mL | Freq: Once | ORAL | 0 refills | Status: AC

## 2023-05-23 ENCOUNTER — Telehealth: Payer: Self-pay

## 2023-05-23 LAB — CBC WITH DIFFERENTIAL/PLATELET
Basophils Absolute: 0 10*3/uL (ref 0.0–0.2)
Basos: 0 %
EOS (ABSOLUTE): 0.1 10*3/uL (ref 0.0–0.4)
Eos: 1 %
Hematocrit: 43.7 % (ref 34.0–46.6)
Hemoglobin: 14.5 g/dL (ref 11.1–15.9)
Immature Grans (Abs): 0 10*3/uL (ref 0.0–0.1)
Immature Granulocytes: 0 %
Lymphocytes Absolute: 1.7 10*3/uL (ref 0.7–3.1)
Lymphs: 36 %
MCH: 30.3 pg (ref 26.6–33.0)
MCHC: 33.2 g/dL (ref 31.5–35.7)
MCV: 91 fL (ref 79–97)
Monocytes Absolute: 0.3 10*3/uL (ref 0.1–0.9)
Monocytes: 6 %
Neutrophils Absolute: 2.6 10*3/uL (ref 1.4–7.0)
Neutrophils: 57 %
Platelets: 225 10*3/uL (ref 150–450)
RBC: 4.79 x10E6/uL (ref 3.77–5.28)
RDW: 11.9 % (ref 11.7–15.4)
WBC: 4.5 10*3/uL (ref 3.4–10.8)

## 2023-05-23 NOTE — Telephone Encounter (Signed)
Patient notified.  CBC is normal.  Hemoglobin is 14.5g, normal, no anemia.  Continue with plan for colonoscopy as scheduled.

## 2023-05-23 NOTE — Progress Notes (Signed)
CBC is normal.  Hemoglobin is 14.5g, normal, no anemia.  Continue with plan for colonoscopy as scheduled.

## 2023-05-27 ENCOUNTER — Other Ambulatory Visit: Payer: Self-pay

## 2023-05-27 DIAGNOSIS — E039 Hypothyroidism, unspecified: Secondary | ICD-10-CM

## 2023-05-27 MED ORDER — LEVOTHYROXINE SODIUM 50 MCG PO TABS
50.0000 ug | ORAL_TABLET | Freq: Every day | ORAL | 1 refills | Status: DC
Start: 2023-05-27 — End: 2023-06-03

## 2023-05-29 ENCOUNTER — Encounter (INDEPENDENT_AMBULATORY_CARE_PROVIDER_SITE_OTHER): Payer: Self-pay

## 2023-06-03 ENCOUNTER — Encounter: Payer: Self-pay | Admitting: Family

## 2023-06-03 DIAGNOSIS — E039 Hypothyroidism, unspecified: Secondary | ICD-10-CM

## 2023-06-03 MED ORDER — LEVOTHYROXINE SODIUM 50 MCG PO TABS
50.0000 ug | ORAL_TABLET | Freq: Every day | ORAL | 3 refills | Status: DC
Start: 2023-06-03 — End: 2024-06-15

## 2023-06-10 ENCOUNTER — Encounter: Payer: Self-pay | Admitting: Family

## 2023-06-10 DIAGNOSIS — B3731 Acute candidiasis of vulva and vagina: Secondary | ICD-10-CM

## 2023-06-10 MED ORDER — FLUCONAZOLE 150 MG PO TABS
ORAL_TABLET | ORAL | 0 refills | Status: DC
Start: 2023-06-10 — End: 2023-06-25

## 2023-06-25 ENCOUNTER — Encounter: Payer: Self-pay | Admitting: Family

## 2023-06-25 ENCOUNTER — Ambulatory Visit (INDEPENDENT_AMBULATORY_CARE_PROVIDER_SITE_OTHER): Payer: Managed Care, Other (non HMO) | Admitting: Family

## 2023-06-25 VITALS — BP 102/74 | HR 88 | Temp 98.1°F | Ht 64.0 in | Wt 143.0 lb

## 2023-06-25 DIAGNOSIS — N920 Excessive and frequent menstruation with regular cycle: Secondary | ICD-10-CM

## 2023-06-25 DIAGNOSIS — E039 Hypothyroidism, unspecified: Secondary | ICD-10-CM | POA: Diagnosis not present

## 2023-06-25 DIAGNOSIS — R7303 Prediabetes: Secondary | ICD-10-CM

## 2023-06-25 DIAGNOSIS — Z13220 Encounter for screening for lipoid disorders: Secondary | ICD-10-CM

## 2023-06-25 DIAGNOSIS — Z23 Encounter for immunization: Secondary | ICD-10-CM

## 2023-06-25 DIAGNOSIS — R195 Other fecal abnormalities: Secondary | ICD-10-CM | POA: Diagnosis not present

## 2023-06-25 DIAGNOSIS — F5102 Adjustment insomnia: Secondary | ICD-10-CM

## 2023-06-25 DIAGNOSIS — Z0000 Encounter for general adult medical examination without abnormal findings: Secondary | ICD-10-CM

## 2023-06-25 MED ORDER — TRAZODONE HCL 50 MG PO TABS
25.0000 mg | ORAL_TABLET | Freq: Every evening | ORAL | 3 refills | Status: DC | PRN
Start: 2023-06-25 — End: 2023-07-24

## 2023-06-25 NOTE — Progress Notes (Signed)
Subjective:  Patient ID: Ann Cooper, female    DOB: 02-11-1986  Age: 37 y.o. MRN: [id number]  Patient Care Team: Mort Sawyers, FNP as PCP - General (Family Medicine)   CC:  Chief Complaint  Patient presents with  . Annual Exam    HPI Ann Cooper is a 37 y.o. female who presents today for an annual physical exam. She reports consuming a general diet.  Started walking a few times a week in the evenings  She generally feels well. She reports sleeping poorly. She does have additional problems to discuss today.   Vision:Within last year Dental:Receives regular dental care STD:The patient denies history of sexually transmitted disease.  Mammogram: < 69 y/o Last pap: 08/24/2019 Colonoscopy: scheduled 05/22/23, fob positive two months ago.   Pt is with acute concerns.   Hard to sleep , falling asleep is giving her issues. Is finding she wakes up nightly around 2 am, and hard to get back to sleep. Does not pee at night time. Mom also with issues with sleep, not sleep apneic. She has tried melatonin and benadryl but not much relief. She does not think that she snores.   Wt Readings from Last 3 Encounters:  06/25/23 143 lb (64.9 kg)  05/22/23 141 lb 12.8 oz (64.3 kg)  04/03/23 146 lb (66.2 kg)   Overweight, not good weight, on maintenance wegovy 2.4 weekly. Tolerating well.   Hypothyroid: 50 mg once daily tolerating well , does take it with her birth control.  Lab Results  Component Value Date   TSH 2.250 11/14/2022     Advanced Directives Patient does not have advanced directives    DEPRESSION SCREENING    06/25/2023    8:33 AM 04/03/2023    8:05 AM 11/14/2022    9:13 AM 07/13/2021    9:38 AM 09/30/2020    1:37 PM 12/16/2018    9:38 AM  PHQ 2/9 Scores  PHQ - 2 Score 0 0 0 0 0 0  PHQ- 9 Score 3  2   0     ROS: Negative unless specifically indicated above in HPI.    Current Outpatient Medications:  .  fluticasone (FLONASE) 50 MCG/ACT nasal spray, PLACE 1 SPRAY INTO  BOTH NOSTRILS DAILY AS NEEDED., Disp: 16 mL, Rfl: 1 .  Levonorgestrel-Ethinyl Estradiol (AMETHIA) 0.1-0.02 & 0.01 MG tablet, Take 1 tablet by mouth daily., Disp: , Rfl:  .  levothyroxine (SYNTHROID) 50 MCG tablet, Take 1 tablet (50 mcg total) by mouth daily., Disp: 90 tablet, Rfl: 3 .  Semaglutide-Weight Management (WEGOVY) 2.4 MG/0.75ML SOAJ, Inject 2.4 mg into the skin once a week., Disp: 9 mL, Rfl: 1 .  tizanidine (ZANAFLEX) 2 MG capsule, Take 1 capsule (2 mg total) by mouth 3 (three) times daily as needed for muscle spasms., Disp: 30 capsule, Rfl: 2 .  traZODone (DESYREL) 50 MG tablet, Take 0.5-1 tablets (25-50 mg total) by mouth at bedtime as needed for sleep., Disp: 30 tablet, Rfl: 3    Objective:    BP 102/74 (BP Location: Right Arm, Patient Position: Sitting, Cuff Size: Normal)   Pulse 88   Temp 98.1 F (36.7 C) (Oral)   Ht 5\' 4"  (1.626 m)   Wt 143 lb (64.9 kg)   SpO2 99%   BMI 24.55 kg/m   BP Readings from Last 3 Encounters:  06/25/23 102/74  05/22/23 113/79  04/03/23 124/84      Physical Exam Constitutional:      General: She is not in acute  distress.    Appearance: Normal appearance. She is normal weight. She is not ill-appearing.  HENT:     Head: Normocephalic.     Right Ear: Tympanic membrane normal.     Left Ear: Tympanic membrane normal.     Nose: Nose normal.     Mouth/Throat:     Mouth: Mucous membranes are moist.  Eyes:     Extraocular Movements: Extraocular movements intact.     Pupils: Pupils are equal, round, and reactive to light.  Cardiovascular:     Rate and Rhythm: Normal rate and regular rhythm.  Pulmonary:     Effort: Pulmonary effort is normal.     Breath sounds: Normal breath sounds.  Abdominal:     General: Abdomen is flat. Bowel sounds are normal.     Palpations: Abdomen is soft.     Tenderness: There is no guarding or rebound.  Musculoskeletal:        General: Normal range of motion.     Cervical back: Normal range of motion.   Skin:    General: Skin is warm.     Capillary Refill: Capillary refill takes less than 2 seconds.  Neurological:     General: No focal deficit present.     Mental Status: She is alert.  Psychiatric:        Mood and Affect: Mood normal.        Behavior: Behavior normal.        Thought Content: Thought content normal.        Judgment: Judgment normal.         Assessment & Plan:  Adjustment insomnia -     traZODone HCl; Take 0.5-1 tablets (25-50 mg total) by mouth at bedtime as needed for sleep.  Dispense: 30 tablet; Refill: 3  Acquired hypothyroidism Assessment & Plan: Educated pt on taking in am, prior to medications and food by at least 30 min to one hour. She has not been compliant with this as she had not been educated per her recollection.   Advised also to separate four hours from anti acid medications or vitamins.    Orders: -     TSH  Menorrhagia with regular cycle Assessment & Plan: Continue ocp   Positive fecal occult blood test Assessment & Plan: Colonoscopy scheduled pending results.     Encounter for general adult medical examination without abnormal findings Assessment & Plan: Patient Counseling(The following topics were reviewed):  Preventative care handout given to pt  Health maintenance and immunizations reviewed. Please refer to Health maintenance section. Pt advised on safe sex, wearing seatbelts in car, and proper nutrition labwork ordered today for annual Dental health: Discussed importance of regular tooth brushing, flossing, and dental visits.   Orders: -     TSH -     Basic metabolic panel -     Lipid panel  Prediabetes -     Hemoglobin A1c  Screening for lipoid disorders -     Lipid panel  Encounter for immunization -     Flu vaccine trivalent PF, 6mos and older(Flulaval,Afluria,Fluarix,Fluzone)      Follow-up: Return in about 1 year (around 06/24/2024) for f/u CPE.   Mort Sawyers, FNP

## 2023-06-25 NOTE — Assessment & Plan Note (Signed)

## 2023-06-25 NOTE — Assessment & Plan Note (Signed)
Colonoscopy scheduled pending results.

## 2023-06-25 NOTE — Assessment & Plan Note (Signed)
Educated pt on taking in am, prior to medications and food by at least 30 min to one hour. She has not been compliant with this as she had not been educated per her recollection.   Advised also to separate four hours from anti acid medications or vitamins.

## 2023-06-25 NOTE — Assessment & Plan Note (Signed)
Continue ocp  ? ?

## 2023-06-25 NOTE — Patient Instructions (Signed)
Please work on the following to help with sleep:  -Sleep only long enough to feel rested then get out of bed -Go to bed and get up at the same time every day. -Do not try to force yourself to sleep. If you can't sleep, get out of bed adn try again later. -Have coffee, tea, and other foods that have caffeine only in the morning. -Avoid alcohol -Keep your bedroom dark, cool, quiet, and free of reminders of work or other things that cause you stress -Exercise several days a week, but not right before bed -Avoid looking at phones or reading devices ("e-books") that give off light before bed. This can make it harder to fall asleep  

## 2023-06-26 LAB — BASIC METABOLIC PANEL
BUN/Creatinine Ratio: 16 (ref 9–23)
BUN: 14 mg/dL (ref 6–20)
CO2: 22 mmol/L (ref 20–29)
Calcium: 9 mg/dL (ref 8.7–10.2)
Chloride: 106 mmol/L (ref 96–106)
Creatinine, Ser: 0.85 mg/dL (ref 0.57–1.00)
Glucose: 114 mg/dL — ABNORMAL HIGH (ref 70–99)
Potassium: 4.6 mmol/L (ref 3.5–5.2)
Sodium: 139 mmol/L (ref 134–144)
eGFR: 89 mL/min/{1.73_m2} (ref >59)

## 2023-06-26 LAB — LIPID PANEL
Chol/HDL Ratio: 3.3 ratio (ref 0.0–4.4)
Cholesterol, Total: 194 mg/dL (ref 100–199)
HDL: 58 mg/dL (ref >39)
LDL Chol Calc (NIH): 117 mg/dL — ABNORMAL HIGH (ref 0–99)
Triglycerides: 106 mg/dL (ref 0–149)
VLDL Cholesterol Cal: 19 mg/dL (ref 5–40)

## 2023-06-26 LAB — HEMOGLOBIN A1C
Est. average glucose Bld gHb Est-mCnc: 103 mg/dL
Hgb A1c MFr Bld: 5.2 % (ref 4.8–5.6)

## 2023-06-26 LAB — TSH: TSH: 1.15 u[IU]/mL (ref 0.450–4.500)

## 2023-06-28 ENCOUNTER — Encounter: Payer: Managed Care, Other (non HMO) | Admitting: Family

## 2023-07-01 ENCOUNTER — Encounter: Payer: Self-pay | Admitting: Physician Assistant

## 2023-07-04 ENCOUNTER — Other Ambulatory Visit: Payer: Self-pay

## 2023-07-04 ENCOUNTER — Ambulatory Visit: Payer: Managed Care, Other (non HMO) | Admitting: Anesthesiology

## 2023-07-04 ENCOUNTER — Encounter
Admission: RE | Disposition: A | Discharge status: Home / Self Care | Payer: Self-pay | Source: Home / Self Care | Attending: Gastroenterology

## 2023-07-04 ENCOUNTER — Encounter: Payer: Self-pay | Admitting: Gastroenterology

## 2023-07-04 ENCOUNTER — Ambulatory Visit
Admission: RE | Admit: 2023-07-04 | Discharge: 2023-07-04 | Disposition: A | Discharge status: Home / Self Care | Payer: Managed Care, Other (non HMO) | Attending: Gastroenterology | Admitting: Gastroenterology

## 2023-07-04 DIAGNOSIS — K621 Rectal polyp: Secondary | ICD-10-CM

## 2023-07-04 DIAGNOSIS — D124 Benign neoplasm of descending colon: Secondary | ICD-10-CM

## 2023-07-04 DIAGNOSIS — E119 Type 2 diabetes mellitus without complications: Secondary | ICD-10-CM | POA: Diagnosis not present

## 2023-07-04 DIAGNOSIS — D125 Benign neoplasm of sigmoid colon: Secondary | ICD-10-CM | POA: Diagnosis not present

## 2023-07-04 DIAGNOSIS — Z7985 Long-term (current) use of injectable non-insulin antidiabetic drugs: Secondary | ICD-10-CM | POA: Insufficient documentation

## 2023-07-04 DIAGNOSIS — Z800 Family history of malignant neoplasm of digestive organs: Secondary | ICD-10-CM | POA: Insufficient documentation

## 2023-07-04 DIAGNOSIS — K635 Polyp of colon: Secondary | ICD-10-CM | POA: Diagnosis not present

## 2023-07-04 DIAGNOSIS — E039 Hypothyroidism, unspecified: Secondary | ICD-10-CM | POA: Diagnosis not present

## 2023-07-04 DIAGNOSIS — Z1211 Encounter for screening for malignant neoplasm of colon: Secondary | ICD-10-CM | POA: Insufficient documentation

## 2023-07-04 DIAGNOSIS — D128 Benign neoplasm of rectum: Secondary | ICD-10-CM | POA: Insufficient documentation

## 2023-07-04 DIAGNOSIS — Z833 Family history of diabetes mellitus: Secondary | ICD-10-CM | POA: Diagnosis not present

## 2023-07-04 DIAGNOSIS — R195 Other fecal abnormalities: Secondary | ICD-10-CM | POA: Diagnosis present

## 2023-07-04 DIAGNOSIS — K6289 Other specified diseases of anus and rectum: Secondary | ICD-10-CM | POA: Diagnosis not present

## 2023-07-04 HISTORY — PX: POLYPECTOMY: SHX5525

## 2023-07-04 HISTORY — PX: BIOPSY: SHX5522

## 2023-07-04 HISTORY — PX: COLONOSCOPY WITH PROPOFOL: SHX5780

## 2023-07-04 LAB — POCT PREGNANCY, URINE: Preg Test, Ur: NEGATIVE

## 2023-07-04 SURGERY — COLONOSCOPY WITH PROPOFOL
Anesthesia: General

## 2023-07-04 MED ORDER — DEXMEDETOMIDINE HCL IN NACL 80 MCG/20ML IV SOLN
INTRAVENOUS | Status: DC | PRN
Start: 2023-07-04 — End: 2023-07-04
  Administered 2023-07-04: 8 ug via INTRAVENOUS
  Administered 2023-07-04: 4 ug via INTRAVENOUS

## 2023-07-04 MED ORDER — LIDOCAINE HCL (CARDIAC) PF 100 MG/5ML IV SOSY
PREFILLED_SYRINGE | INTRAVENOUS | Status: DC | PRN
  Administered 2023-07-04: 40 mg via INTRAVENOUS

## 2023-07-04 MED ORDER — PROPOFOL 500 MG/50ML IV EMUL
INTRAVENOUS | Status: DC | PRN
  Administered 2023-07-04: 175 ug/kg/min via INTRAVENOUS

## 2023-07-04 MED ORDER — PROPOFOL 10 MG/ML IV BOLUS
INTRAVENOUS | Status: DC | PRN
  Administered 2023-07-04: 40 mg via INTRAVENOUS
  Administered 2023-07-04: 20 mg via INTRAVENOUS
  Administered 2023-07-04: 80 mg via INTRAVENOUS

## 2023-07-04 MED ORDER — SODIUM CHLORIDE 0.9 % IV SOLN: INTRAVENOUS | Status: DC

## 2023-07-04 MED ORDER — SIMETHICONE 40 MG/0.6ML PO SUSP
ORAL | Status: DC | PRN
Start: 2023-07-04 — End: 2023-07-04
  Administered 2023-07-04: 120 mL

## 2023-07-04 MED ORDER — PROPOFOL 1000 MG/100ML IV EMUL
INTRAVENOUS | Status: AC
  Filled 2023-07-04: qty 100

## 2023-07-04 NOTE — Anesthesia Preprocedure Evaluation (Signed)
Anesthesia Evaluation  Patient identified by MRN, date of birth, ID band Patient awake    Reviewed: Allergy & Precautions, NPO status , Patient's Chart, lab work & pertinent test results  History of Anesthesia Complications Negative for: history of anesthetic complications  Airway Mallampati: III  TM Distance: >3 FB Neck ROM: full    Dental  (+) Chipped, Poor Dentition   Pulmonary neg shortness of breath, Current Smoker   Pulmonary exam normal        Cardiovascular Exercise Tolerance: Good (-) angina negative cardio ROS Normal cardiovascular exam     Neuro/Psych  Neuromuscular disease  negative psych ROS   GI/Hepatic negative GI ROS, Neg liver ROS,neg GERD  ,,  Endo/Other  diabetes, Type 2Hypothyroidism    Renal/GU negative Renal ROS  negative genitourinary   Musculoskeletal   Abdominal   Peds  Hematology negative hematology ROS (+)   Anesthesia Other Findings Past Medical History: No date: Allergy No date: Depression No date: Gestational diabetes No date: History of chicken pox No date: History of hypothyroidism No date: Thyroid disease  Past Surgical History: 02/2011: NPFL left knee     Comment:  and menscus repair  BMI    Body Mass Index: 23.69 kg/m      Reproductive/Obstetrics negative OB ROS                             Anesthesia Physical Anesthesia Plan  ASA: 2  Anesthesia Plan: General   Post-op Pain Management:    Induction: Intravenous  PONV Risk Score and Plan: Propofol infusion and TIVA  Airway Management Planned: Natural Airway and Nasal Cannula  Additional Equipment:   Intra-op Plan:   Post-operative Plan:   Informed Consent: I have reviewed the patients History and Physical, chart, labs and discussed the procedure including the risks, benefits and alternatives for the proposed anesthesia with the patient or authorized representative who has  indicated his/her understanding and acceptance.     Dental Advisory Given  Plan Discussed with: Anesthesiologist, CRNA and Surgeon  Anesthesia Plan Comments: (Patient consented for risks of anesthesia including but not limited to:  - adverse reactions to medications - risk of airway placement if required - damage to eyes, teeth, lips or other oral mucosa - nerve damage due to positioning  - sore throat or hoarseness - Damage to heart, brain, nerves, lungs, other parts of body or loss of life  Patient voiced understanding.)       Anesthesia Quick Evaluation

## 2023-07-04 NOTE — Anesthesia Postprocedure Evaluation (Signed)
Anesthesia Post Note  Patient: Nickisha Keelin  Procedure(s) Performed: COLONOSCOPY WITH PROPOFOL POLYPECTOMY HOT HEMOSTASIS (ARGON PLASMA COAGULATION/BICAP) BIOPSY  Patient location during evaluation: Endoscopy Anesthesia Type: General Level of consciousness: awake and alert Pain management: pain level controlled Vital Signs Assessment: post-procedure vital signs reviewed and stable Respiratory status: spontaneous breathing, nonlabored ventilation, respiratory function stable and patient connected to nasal cannula oxygen Cardiovascular status: blood pressure returned to baseline and stable Postop Assessment: no apparent nausea or vomiting Anesthetic complications: no   No notable events documented.   Last Vitals:  Vitals:   07/04/23 0910 07/04/23 0920  BP: 99/72 106/70  Pulse: 94 84  Resp: (!) 26 15  Temp:  36.6 C  SpO2: 100% 100%    Last Pain:  Vitals:   07/04/23 0920  TempSrc: Temporal  PainSc: 0-No pain                 Cleda Mccreedy Paizlie Klaus

## 2023-07-04 NOTE — Transfer of Care (Signed)
Immediate Anesthesia Transfer of Care Note  Patient: Ann Cooper  Procedure(s) Performed: COLONOSCOPY WITH PROPOFOL POLYPECTOMY HOT HEMOSTASIS (ARGON PLASMA COAGULATION/BICAP) BIOPSY  Patient Location: Endoscopy Unit  Anesthesia Type:MAC  Level of Consciousness: awake, alert , and oriented  Airway & Oxygen Therapy: Patient Spontanous Breathing and Patient connected to face mask oxygen  Post-op Assessment: Report given to RN, Post -op Vital signs reviewed and stable, and Patient moving all extremities X 4  Post vital signs: Reviewed and stable  Last Vitals:  Vitals Value Taken Time  BP    Temp 36.6 C 07/04/23 0900  Pulse 94 07/04/23 0900  Resp 20 07/04/23 0900  SpO2 100 % 07/04/23 0900    Last Pain:  Vitals:   07/04/23 0900  TempSrc: Temporal  PainSc: 0-No pain         Complications: No notable events documented.

## 2023-07-04 NOTE — Op Note (Signed)
[facility] Gastroenterology Patient Name: Ann Cooper Procedure Date: 07/04/2023 7:29 AM MRN: [id number] Account #: [id number] Date of Birth: [birth year] Admit Type: Outpatient Age: 37 Room: [facility] ENDO ROOM 2 Gender: Female Note Status: Finalized Instrument Name: Prentice Docker [id number] Procedure:             Colonoscopy Indications:           Positive fecal immunochemical test Providers:             Toney Reil MD, MD Referring MD:          Mort Sawyers (Referring MD) Medicines:             General Anesthesia Complications:         No immediate complications. Estimated blood loss: None. Procedure:             Pre-Anesthesia Assessment:                        - Prior to the procedure, a History and Physical was                         performed, and patient medications and allergies were                         reviewed. The patient is competent. The risks and                         benefits of the procedure and the sedation options and                         risks were discussed with the patient. All questions                         were answered and informed consent was obtained.                         Patient identification and proposed procedure were                         verified by the physician, the nurse, the                         anesthesiologist, the anesthetist and the technician                         in the pre-procedure area in the procedure room in the                         endoscopy suite. Mental Status Examination: alert and                         oriented. Airway Examination: normal oropharyngeal                         airway and neck mobility. Respiratory Examination:                         clear to auscultation. CV Examination: normal.  Prophylactic Antibiotics: The patient does not require                         prophylactic antibiotics. Prior Anticoagulants: The                         patient has taken  no anticoagulant or antiplatelet                         agents. ASA Grade Assessment: II - A patient with mild                         systemic disease. After reviewing the risks and                         benefits, the patient was deemed in satisfactory                         condition to undergo the procedure. The anesthesia                         plan was to use general anesthesia. Immediately prior                         to administration of medications, the patient was                         re-assessed for adequacy to receive sedatives. The                         heart rate, respiratory rate, oxygen saturations,                         blood pressure, adequacy of pulmonary ventilation, and                         response to care were monitored throughout the                         procedure. The physical status of the patient was                         re-assessed after the procedure.                        After obtaining informed consent, the colonoscope was                         passed under direct vision. Throughout the procedure,                         the patient's blood pressure, pulse, and oxygen                         saturations were monitored continuously. The                         Colonoscope was introduced through the anus and  advanced to the the cecum, identified by appendiceal                         orifice and ileocecal valve. The colonoscopy was                         performed without difficulty. The patient tolerated                         the procedure well. The quality of the bowel                         preparation was evaluated using the BBPS [employer] [facility] Bowel                         Preparation Scale) with scores of: Right Colon = 3,                         Transverse Colon = 3 and Left Colon = 3 (entire mucosa                         seen well with no residual staining, small fragments                         of stool or  opaque liquid). The total BBPS score                         equals 9. The ileocecal valve, appendiceal orifice,                         and rectum were photographed. Findings:      The perianal and digital rectal examinations were normal. Pertinent       negatives include normal sphincter tone and no palpable rectal lesions.      A 7 mm polyp was found in the descending colon. The polyp was sessile.       The polyp was removed with a cold snare. Resection and retrieval were       complete.      A 12 mm polyp was found in the sigmoid colon. The polyp was       pedunculated. The polyp was removed with a hot snare. Resection and       retrieval were complete. Estimated blood loss: none.      A diminutive polyp was found in the distal rectum. The polyp was       sessile. The polyp was removed with a cold biopsy forceps. Resection and       retrieval were complete.      A diffuse area of mildly erythematous mucosa was found in the distal       rectum. Biopsies were taken with a cold forceps for histology. Impression:            - One 7 mm polyp in the descending colon, removed with                         a cold snare. Resected and retrieved.                        -  One 12 mm polyp in the sigmoid colon, removed with a                         hot snare. Resected and retrieved.                        - One diminutive polyp in the distal rectum, removed                         with a cold biopsy forceps. Resected and retrieved.                        - Erythematous mucosa in the distal rectum. Biopsied. Recommendation:        - Discharge patient to home (with escort).                        - Resume previous diet today.                        - Continue present medications.                        - Await pathology results.                        - Repeat colonoscopy in 3 years for surveillance. Procedure Code(s):     --- Professional ---                        [phone], Colonoscopy, flexible;  with removal of                         tumor(s), polyp(s), or other lesion(s) by snare                         technique                        [id number], 59, Colonoscopy, flexible; with biopsy, single                         or multiple Diagnosis Code(s):     --- Professional ---                        D12.5, Benign neoplasm of sigmoid colon                        D12.4, Benign neoplasm of descending colon                        D12.8, Benign neoplasm of rectum                        R19.5, Other fecal abnormalities                        K62.89, Other specified diseases of anus and rectum CPT copyright 2022 American Medical Association. All rights reserved. The codes documented in this report are preliminary and upon coder review may  be revised to meet current compliance requirements. Dr. Libby Maw  Toney Reil MD, MD 07/04/2023 8:58:49 AM This report has been signed electronically. Number of Addenda: 0 Note Initiated On: 07/04/2023 7:29 AM Scope Withdrawal Time: 0 hours 20 minutes 0 seconds  Total Procedure Duration: 0 hours 26 minutes 57 seconds  Estimated Blood Loss:  Estimated blood loss: none.      [facility]

## 2023-07-04 NOTE — H&P (Signed)
Arlyss Repress, MD [address]  [address]  [place], [place] [address]  Main: [phone]  Fax: [phone] Pager: [phone]  Primary Care Physician:  Mort Sawyers, FNP Primary Gastroenterologist:  Dr. Arlyss Repress  Pre-Procedure History & Physical: HPI:  Ann Cooper is a 37 y.o. female is here for an colonoscopy.   Past Medical History:  Diagnosis Date   Allergy    Depression    Gestational diabetes    History of chicken pox    History of hypothyroidism    Thyroid disease     Past Surgical History:  Procedure Laterality Date   NPFL left knee  02/2011   and menscus repair    Prior to Admission medications   Medication Sig Start Date End Date Taking? Authorizing Provider  levothyroxine (SYNTHROID) 50 MCG tablet Take 1 tablet (50 mcg total) by mouth daily. 06/03/23 06/02/24 Yes Dugal, Wyatt Mage, FNP  fluticasone (FLONASE) 50 MCG/ACT nasal spray PLACE 1 SPRAY INTO BOTH NOSTRILS DAILY AS NEEDED. 03/20/22   Gweneth Dimitri, MD  Levonorgestrel-Ethinyl Estradiol (AMETHIA) 0.1-0.02 & 0.01 MG tablet Take 1 tablet by mouth daily.    [provider]  Semaglutide-Weight Management (WEGOVY) 2.4 MG/0.75ML SOAJ Inject 2.4 mg into the skin once a week. 04/03/23   Mort Sawyers, FNP  tizanidine (ZANAFLEX) 2 MG capsule Take 1 capsule (2 mg total) by mouth 3 (three) times daily as needed for muscle spasms. 02/19/23   Mort Sawyers, FNP  traZODone (DESYREL) 50 MG tablet Take 0.5-1 tablets (25-50 mg total) by mouth at bedtime as needed for sleep. 06/25/23   Mort Sawyers, FNP    Allergies as of 05/22/2023 - Review Complete 05/22/2023  Allergen Reaction Noted   Red dye #40 (allura red) Nausea And Vomiting 09/30/2020   Tree extract  12/16/2018    Family History  Problem Relation Age of Onset   Arthritis Mother    Asthma Mother    Depression Mother    Miscarriages / [place] Mother    Asthma Sister    Depression Sister    Arthritis Maternal Grandmother    Asthma  Maternal Grandmother    Depression Maternal Grandmother    Scoliosis Maternal Grandmother    Alcohol abuse Maternal Grandfather    Diabetes Maternal Grandfather    Colon cancer Maternal Grandfather     Social History   Socioeconomic History   Marital status: Married    Spouse name: Onalee Hua   Number of children: Not on file   Years of education: Bachelors degree   Highest education level: Bachelor's degree (e.g., BA, AB, BS)  Occupational History   Not on file  Tobacco Use   Smoking status: Never   Smokeless tobacco: Never  Vaping Use   Vaping status: Never Used  Substance and Sexual Activity   Alcohol use: Not Currently    Comment: once a month   Drug use: Yes    Types: Marijuana    Comment: sunday   Sexual activity: Yes    Partners: Male    Birth control/protection: None  Other Topics Concern   Not on file  Social History Narrative   Lives with Onalee Hua      Pets: dog, cat, and bird   Children: Maisie Fus, 2 y/o  (2023)   Works at [employer]   Enjoys: walks with the dog, watching sports, puzzles   Exercise: started back at the [facility] - 5 times a week, walking on the treadmill 3.2 miles   Diet: pretty healthy, whole foods delivery -  low carbs, meat/fruit/veggies      Biological dad was sperm donor, 'dad' was not able to have children himself.    Social Determinants of Health   Financial Resource Strain: Low Risk  (03/30/2023)   Overall Financial Resource Strain (CARDIA)    Difficulty of Paying Living Expenses: Not hard at all  Food Insecurity: No Food Insecurity (03/30/2023)   Hunger Vital Sign    Worried About Running Out of Food in the Last Year: Never true    Ran Out of Food in the Last Year: Never true  Transportation Needs: No Transportation Needs (03/30/2023)   PRAPARE - Administrator, Civil Service (Medical): No    Lack of Transportation (Non-Medical): No  Physical Activity: Insufficiently Active (03/30/2023)   Exercise Vital Sign    Days of Exercise per  Week: 3 days    Minutes of Exercise per Session: 30 min  Stress: No Stress Concern Present (03/30/2023)   [employer] of Occupational Health - Occupational Stress Questionnaire    Feeling of Stress : Only a little  Social Connections: Moderately Integrated (03/30/2023)   Social Connection and Isolation Panel [NHANES]    Frequency of Communication with Friends and Family: More than three times a week    Frequency of Social Gatherings with Friends and Family: Once a week    Attends Religious Services: 1 to 4 times per year    Active Member of [employer] or Organizations: No    Attends Engineer, structural: Not on file    Marital Status: Married  Catering manager Violence: Not on file    Review of Systems: See HPI, otherwise negative ROS  Physical Exam: BP 119/81   Pulse (!) 110   Temp 97.6 F (36.4 C) (Temporal)   Resp (!) 22   Ht 5\' 4"  (1.626 m)   Wt 62.6 kg   SpO2 98%   BMI 23.69 kg/m  General:   Alert,  pleasant and cooperative in NAD Head:  Normocephalic and atraumatic. Neck:  Supple; no masses or thyromegaly. Lungs:  Clear throughout to auscultation.    Heart:  Regular rate and rhythm. Abdomen:  Soft, nontender and nondistended. Normal bowel sounds, without guarding, and without rebound.   Neurologic:  Alert and  oriented x4;  grossly normal neurologically.  Impression/Plan: Ann Cooper is here for an colonoscopy to be performed for fecal occult blood positive  Risks, benefits, limitations, and alternatives regarding  colonoscopy have been reviewed with the patient.  Questions have been answered.  All parties agreeable.   Lannette Donath, MD  07/04/2023, 8:17 AM

## 2023-07-05 ENCOUNTER — Encounter: Payer: Self-pay | Admitting: Gastroenterology

## 2023-07-08 ENCOUNTER — Encounter: Payer: Self-pay | Admitting: Gastroenterology

## 2023-07-08 ENCOUNTER — Encounter: Payer: Self-pay | Admitting: Family

## 2023-07-08 ENCOUNTER — Other Ambulatory Visit: Payer: Self-pay

## 2023-07-08 ENCOUNTER — Ambulatory Visit
Admission: RE | Admit: 2023-07-08 | Discharge: 2023-07-08 | Disposition: A | Discharge status: Home / Self Care | Payer: Managed Care, Other (non HMO) | Source: Ambulatory Visit | Attending: Emergency Medicine | Admitting: Emergency Medicine

## 2023-07-08 VITALS — BP 103/70 | HR 100 | Temp 99.2°F | Resp 18

## 2023-07-08 DIAGNOSIS — J069 Acute upper respiratory infection, unspecified: Secondary | ICD-10-CM

## 2023-07-08 MED ORDER — AMOXICILLIN-POT CLAVULANATE 875-125 MG PO TABS: 1.0000 | ORAL_TABLET | Freq: Two times a day (BID) | ORAL | 0 refills | Status: AC

## 2023-07-08 NOTE — Progress Notes (Signed)
noted 

## 2023-07-08 NOTE — Telephone Encounter (Signed)
Noted  

## 2023-07-08 NOTE — ED Provider Notes (Signed)
Renaldo Fiddler    CSN: [id number] Arrival date & time: 07/08/23  1851      History   Chief Complaint Chief Complaint  Patient presents with   Cough    Entered by patient   Appointment    19:00    HPI Ann Cooper is a 37 y.o. female.   Presents for evaluation of a productive cough with green-yellow sputum, intermittent generalized headaches, sinus pressure behind the eyes and along the forehead, malaise and fatigue, nasal congestion and rhinorrhea present for 11 days.  Known sick contact with similar symptoms within household.  Has attempted use of Mucinex, nasal spray, Delsym and Tessalon which has been somewhat helpful but symptoms have continued to persist.  Denies presence of shortness of breath or wheezing.  Denies respiratory history, non-smoker.   Past Medical History:  Diagnosis Date   Allergy    Depression    Gestational diabetes    History of chicken pox    History of hypothyroidism    Thyroid disease     Patient Active Problem List   Diagnosis Date Noted   Erythema of rectum 07/04/2023   Polyp of rectum 07/04/2023   Adenomatous polyp of descending colon 07/04/2023   Polyp of sigmoid colon 07/04/2023   Positive fecal occult blood test 04/03/2023   Menorrhagia with regular cycle 06/26/2022   Hypothyroidism 07/13/2021   Cervical radiculopathy 10/29/2018    Past Surgical History:  Procedure Laterality Date   BIOPSY  07/04/2023   Procedure: BIOPSY;  Surgeon: Toney Reil, MD;  Location: [facility];  Service: Gastroenterology;;   COLONOSCOPY WITH PROPOFOL N/A 07/04/2023   Procedure: COLONOSCOPY WITH PROPOFOL;  Surgeon: Toney Reil, MD;  Location: [facility];  Service: Gastroenterology;  Laterality: N/A;   NPFL left knee  02/2011   and menscus repair   POLYPECTOMY  07/04/2023   Procedure: POLYPECTOMY;  Surgeon: Toney Reil, MD;  Location: [facility]COPY;  Service: Gastroenterology;;    OB History     Gravida  1   Para   1   Term  1   Preterm      AB      Living  1      SAB      IAB      Ectopic      Multiple  0   Live Births  1            Home Medications    Prior to Admission medications   Medication Sig Start Date End Date Taking? Authorizing Provider  amoxicillin-clavulanate (AUGMENTIN) 875-125 MG tablet Take 1 tablet by mouth every 12 (twelve) hours for 10 days. 07/08/23 07/18/23 Yes Merton Wadlow R, NP  fluticasone (FLONASE) 50 MCG/ACT nasal spray PLACE 1 SPRAY INTO BOTH NOSTRILS DAILY AS NEEDED. 03/20/22   Gweneth Dimitri, MD  Levonorgestrel-Ethinyl Estradiol (AMETHIA) 0.1-0.02 & 0.01 MG tablet Take 1 tablet by mouth daily.    [provider]  levothyroxine (SYNTHROID) 50 MCG tablet Take 1 tablet (50 mcg total) by mouth daily. 06/03/23 06/02/24  Mort Sawyers, FNP  Semaglutide-Weight Management (WEGOVY) 2.4 MG/0.75ML SOAJ Inject 2.4 mg into the skin once a week. 04/03/23   Mort Sawyers, FNP  tizanidine (ZANAFLEX) 2 MG capsule Take 1 capsule (2 mg total) by mouth 3 (three) times daily as needed for muscle spasms. 02/19/23   Mort Sawyers, FNP  traZODone (DESYREL) 50 MG tablet Take 0.5-1 tablets (25-50 mg total) by mouth at bedtime as needed for sleep. 06/25/23  Mort Sawyers, FNP    Family History Family History  Problem Relation Age of Onset   Arthritis Mother    Asthma Mother    Depression Mother    Miscarriages / [place] Mother    Asthma Sister    Depression Sister    Arthritis Maternal Grandmother    Asthma Maternal Grandmother    Depression Maternal Grandmother    Scoliosis Maternal Grandmother    Alcohol abuse Maternal Grandfather    Diabetes Maternal Grandfather    Colon cancer Maternal Grandfather     Social History Social History   Tobacco Use   Smoking status: Never   Smokeless tobacco: Never  Vaping Use   Vaping status: Never Used  Substance Use Topics   Alcohol use: Not Currently    Comment: once a month   Drug use: Yes    Types:  Marijuana    Comment: sunday     Allergies   Red dye #40 (allura red) and Tree extract   Review of Systems Review of Systems  Respiratory:  Positive for cough.      Physical Exam Triage Vital Signs ED Triage Vitals  Encounter Vitals Group     BP 07/08/23 1924 103/70     Systolic BP Percentile --      Diastolic BP Percentile --      Pulse Rate 07/08/23 1924 100     Resp 07/08/23 1924 18     Temp 07/08/23 1924 99.2 F (37.3 C)     Temp Source 07/08/23 1924 Oral     SpO2 07/08/23 1924 100 %     Weight --      Height --      Head Circumference --      Peak Flow --      Pain Score 07/08/23 1909 8     Pain Loc --      Pain Education --      Exclude from Growth Chart --    No data found.  Updated Vital Signs BP 103/70 (BP Location: Left Arm)   Pulse 100   Temp 99.2 F (37.3 C) (Oral)   Resp 18   SpO2 100%   Visual Acuity Right Eye Distance:   Left Eye Distance:   Bilateral Distance:    Right Eye Near:   Left Eye Near:    Bilateral Near:     Physical Exam Constitutional:      Appearance: Normal appearance.  HENT:     Head: Normocephalic.     Right Ear: Tympanic membrane, ear canal and external ear normal.     Left Ear: Tympanic membrane, ear canal and external ear normal.     Nose: Congestion and rhinorrhea present.     Mouth/Throat:     Mouth: Mucous membranes are moist.     Pharynx: Oropharynx is clear. Posterior oropharyngeal erythema present. No oropharyngeal exudate.  Eyes:     Extraocular Movements: Extraocular movements intact.  Cardiovascular:     Rate and Rhythm: Normal rate and regular rhythm.     Pulses: Normal pulses.     Heart sounds: Normal heart sounds.  Pulmonary:     Effort: Pulmonary effort is normal.     Comments: Mild expiratory wheeze present to the left upper lobe, all remaining lobes clear Musculoskeletal:     Cervical back: Normal range of motion and neck supple.  Skin:    General: Skin is warm and dry.  Neurological:      Mental Status: She is  alert and oriented to person, place, and time. Mental status is at baseline.      UC Treatments / Results  Labs (all labs ordered are listed, but only abnormal results are displayed) Labs Reviewed - No data to display  EKG   Radiology No results found.  Procedures Procedures (including critical care time)  Medications Ordered in UC Medications - No data to display  Initial Impression / Assessment and Plan / UC Course  I have reviewed the triage vital signs and the nursing notes.  Pertinent labs & imaging results that were available during my care of the patient were reviewed by me and considered in my medical decision making (see chart for details).  Acute upper respiratory infection  Patient is in no signs of distress nor toxic appearing.  Vital signs are stable.  Low suspicion for pneumonia, pneumothorax or bronchitis and therefore will defer imaging.  Viral testing deferred due to timeline of illness.  Presentation and symptomology consistent with sinusitis and the symptoms have persisted for 11 days without signs of resolution we will provide bacterial coverage.  Augmentin sent to pharmacy. May use additional over-the-counter medications as needed for supportive care.  May follow-up with urgent care as needed if symptoms persist or worsen.  Final Clinical Impressions(s) / UC Diagnoses   Final diagnoses:  Acute URI     Discharge Instructions      Begin Augmentin every morning and every evening for 10 days will take 48 hours to reach full effect within the body but she sees small improvements diabetic    You can take Tylenol and/or Ibuprofen as needed for fever reduction and pain relief.   For cough: honey 1/2 to 1 teaspoon (you can dilute the honey in water or another fluid).  You can also use guaifenesin and dextromethorphan for cough. You can use a humidifier for chest congestion and cough.  If you don't have a humidifier, you can sit in the  bathroom with the hot shower running.      For sore throat: try warm salt water gargles, cepacol lozenges, throat spray, warm tea or water with lemon/honey, popsicles or ice, or OTC cold relief medicine for throat discomfort.   For congestion: take a daily anti-histamine like Zyrtec, Claritin, and a oral decongestant, such as pseudoephedrine.  You can also use Flonase 1-2 sprays in each nostril daily.   It is important to stay hydrated: drink plenty of fluids (water, gatorade/powerade/pedialyte, juices, or teas) to keep your throat moisturized and help further relieve irritation/discomfort.    ED Prescriptions     Medication Sig Dispense Auth. Provider   amoxicillin-clavulanate (AUGMENTIN) 875-125 MG tablet Take 1 tablet by mouth every 12 (twelve) hours for 10 days. 20 tablet Valinda Hoar, NP      PDMP not reviewed this encounter.   Valinda Hoar, [place] 07/08/23 [phone]

## 2023-07-08 NOTE — Group Note (Deleted)

## 2023-07-08 NOTE — ED Triage Notes (Signed)
Symptoms started 8/28.  Symptoms initially was a dry cough.  Now productive cough, green-yellow phlegm.  Headache, general malaise.    Has taken mucinex, nasal spray, delsym, and prescription tessalon.

## 2023-07-08 NOTE — Discharge Instructions (Signed)
Begin Augmentin every morning and every evening for 10 days will take 48 hours to reach full effect within the body but she sees small improvements diabetic    You can take Tylenol and/or Ibuprofen as needed for fever reduction and pain relief.   For cough: honey 1/2 to 1 teaspoon (you can dilute the honey in water or another fluid).  You can also use guaifenesin and dextromethorphan for cough. You can use a humidifier for chest congestion and cough.  If you don't have a humidifier, you can sit in the bathroom with the hot shower running.      For sore throat: try warm salt water gargles, cepacol lozenges, throat spray, warm tea or water with lemon/honey, popsicles or ice, or OTC cold relief medicine for throat discomfort.   For congestion: take a daily anti-histamine like Zyrtec, Claritin, and a oral decongestant, such as pseudoephedrine.  You can also use Flonase 1-2 sprays in each nostril daily.   It is important to stay hydrated: drink plenty of fluids (water, gatorade/powerade/pedialyte, juices, or teas) to keep your throat moisturized and help further relieve irritation/discomfort.

## 2023-07-17 ENCOUNTER — Encounter: Payer: Self-pay | Admitting: Family

## 2023-07-17 NOTE — Telephone Encounter (Signed)
Ann Cooper, pt rescheduled same day for her pap today but can we make sure she is not charged a no show fee?   She was on her period.

## 2023-07-18 ENCOUNTER — Other Ambulatory Visit: Payer: Self-pay | Admitting: Family

## 2023-07-18 ENCOUNTER — Ambulatory Visit: Payer: Managed Care, Other (non HMO) | Admitting: Family

## 2023-07-18 NOTE — Telephone Encounter (Signed)
Error

## 2023-07-24 ENCOUNTER — Encounter: Payer: Self-pay | Admitting: Family

## 2023-07-24 DIAGNOSIS — F5102 Adjustment insomnia: Secondary | ICD-10-CM

## 2023-07-24 MED ORDER — TRAZODONE HCL 50 MG PO TABS: 25.0000 mg | ORAL_TABLET | Freq: Every evening | ORAL | 3 refills | Status: DC | PRN

## 2023-07-31 ENCOUNTER — Encounter: Payer: Self-pay | Admitting: Family

## 2023-07-31 ENCOUNTER — Other Ambulatory Visit (HOSPITAL_COMMUNITY)
Admission: RE | Admit: 2023-07-31 | Discharge: 2023-07-31 | Disposition: A | Discharge status: Home / Self Care | Payer: Managed Care, Other (non HMO) | Source: Ambulatory Visit | Attending: Family | Admitting: Family

## 2023-07-31 ENCOUNTER — Ambulatory Visit: Payer: Managed Care, Other (non HMO) | Admitting: Family

## 2023-07-31 VITALS — BP 102/62 | HR 86 | Temp 97.8°F | Ht 64.0 in | Wt 140.8 lb

## 2023-07-31 DIAGNOSIS — N898 Other specified noninflammatory disorders of vagina: Secondary | ICD-10-CM

## 2023-07-31 DIAGNOSIS — E039 Hypothyroidism, unspecified: Secondary | ICD-10-CM

## 2023-07-31 DIAGNOSIS — G479 Sleep disorder, unspecified: Secondary | ICD-10-CM | POA: Diagnosis not present

## 2023-07-31 DIAGNOSIS — Z113 Encounter for screening for infections with a predominantly sexual mode of transmission: Secondary | ICD-10-CM | POA: Diagnosis present

## 2023-07-31 DIAGNOSIS — Z860100 Personal history of colon polyps, unspecified: Secondary | ICD-10-CM | POA: Insufficient documentation

## 2023-07-31 NOTE — Assessment & Plan Note (Signed)
Improving Continue trazodone 50 mg prn

## 2023-07-31 NOTE — Assessment & Plan Note (Signed)
Tsh stable. Continue levothyroxine 50 mcg once daily

## 2023-07-31 NOTE — Assessment & Plan Note (Signed)
Wet prep ordered  Pending results.

## 2023-07-31 NOTE — Assessment & Plan Note (Signed)
Safe sex d/w pt .  Std panel ordered today. Pending results.   Pap performed in office today, r/o hpv

## 2023-07-31 NOTE — Progress Notes (Signed)
Subjective:  Patient ID: Ann Cooper, female    DOB: Mar 04, 1986  Age: 37 y.o. MRN: [id number]  Patient Care Team: Mort Sawyers, FNP as PCP - General (Family Medicine)   CC:  Chief Complaint  Patient presents with   Medical Management of Chronic Issues    HPI Ann Cooper is a 37 y.o. female who presents today for a pap and f/u on insomnia.   Doing much better with trazodone 50 mg once daily, sleeping well.   Last pap: 08/24/2019 negative had ASCUS in the past but not since.  Colonoscopy: 07/04/2019 repeat 3 years, multiple polyps  Bone density scan:  Pt is without acute concerns.    No breast rash, no masses and or nipple discharge.  No vaginal discharge out of the ordinary, no pain with sex.  Menses: irregular at times, will last for three weeks, on birth control   DEPRESSION SCREENING    06/25/2023    8:33 AM 04/03/2023    8:05 AM 11/14/2022    9:13 AM 07/13/2021    9:38 AM 09/30/2020    1:37 PM 12/16/2018    9:38 AM  PHQ 2/9 Scores  PHQ - 2 Score 0 0 0 0 0 0  PHQ- 9 Score 3  2   0     ROS: Negative unless specifically indicated above in HPI.    Current Outpatient Medications:    fluticasone (FLONASE) 50 MCG/ACT nasal spray, PLACE 1 SPRAY INTO BOTH NOSTRILS DAILY AS NEEDED., Disp: 16 mL, Rfl: 1   Levonorgestrel-Ethinyl Estradiol (AMETHIA) 0.1-0.02 & 0.01 MG tablet, Take 1 tablet by mouth daily., Disp: , Rfl:    levothyroxine (SYNTHROID) 50 MCG tablet, Take 1 tablet (50 mcg total) by mouth daily., Disp: 90 tablet, Rfl: 3   Semaglutide-Weight Management (WEGOVY) 2.4 MG/0.75ML SOAJ, Inject 2.4 mg into the skin once a week., Disp: 9 mL, Rfl: 1   tizanidine (ZANAFLEX) 2 MG capsule, Take 1 capsule (2 mg total) by mouth 3 (three) times daily as needed for muscle spasms., Disp: 30 capsule, Rfl: 2   traZODone (DESYREL) 50 MG tablet, Take 0.5-1 tablets (25-50 mg total) by mouth at bedtime as needed for sleep., Disp: 90 tablet, Rfl: 3    Objective:    BP 102/62 (BP Location:  Left Arm, Patient Position: Sitting, Cuff Size: Normal)   Pulse 86   Temp 97.8 F (36.6 C) (Temporal)   Ht 5\' 4"  (1.626 m)   Wt 140 lb 12.8 oz (63.9 kg)   SpO2 98%   BMI 24.17 kg/m   BP Readings from Last 3 Encounters:  07/31/23 102/62  07/08/23 103/70  07/04/23 106/70      @PHYSEXAMBYAGE @  Gen: NAD, resting comfortably Breasts: breasts appear normal, no suspicious masses, no skin or nipple changes or axillary nodes Physical Exam Genitourinary:    General: Normal vulva.     Pubic Area: No rash.      Labia:        Right: No rash, tenderness, lesion or injury.        Left: No rash, tenderness, lesion or injury.      Urethra: No prolapse or urethral pain.     Vagina: Normal. No vaginal discharge, tenderness or lesions.     Cervix: white thin milky discharge      Rectum: Normal.  Psych: Normal affect and thought content      Assessment & Plan:  Screening for STD (sexually transmitted disease) Assessment & Plan: Safe sex d/w pt .  Std panel ordered today. Pending results.   Pap performed in office today, r/o hpv  Orders: -     Cytology - PAP -     Hepatitis C antibody -     HIV Antibody (routine testing w rflx) -     RPR  Vaginal discharge Assessment & Plan: Wet prep ordered  Pending results.  Orders: -     WET PREP BY MOLECULAR PROBE  Acquired hypothyroidism Assessment & Plan: Tsh stable Continue levothyroxine 50 mcg once daily    History of colon polyps  Sleep disorder Assessment & Plan: Improving Continue trazodone 50 mg prn       Follow-up: Return in about 1 year (around 07/30/2024) for f/u CPE.   Mort Sawyers, FNP

## 2023-08-01 ENCOUNTER — Encounter: Payer: Self-pay | Admitting: Family

## 2023-08-01 LAB — WET PREP BY MOLECULAR PROBE
Candida species: NOT DETECTED
Gardnerella vaginalis: NOT DETECTED
MICRO NUMBER:: 15542384
SPECIMEN QUALITY:: ADEQUATE
Trichomonas vaginosis: NOT DETECTED

## 2023-08-01 LAB — HIV ANTIBODY (ROUTINE TESTING W REFLEX): HIV Screen 4th Generation wRfx: NONREACTIVE

## 2023-08-01 LAB — HEPATITIS C ANTIBODY: Hep C Virus Ab: NONREACTIVE

## 2023-08-01 LAB — HM HIV SCREENING LAB
HM HIV Screening: NEGATIVE
HM Hepatitis Screen: NEGATIVE

## 2023-08-01 LAB — RPR: RPR Ser Ql: NONREACTIVE

## 2023-08-07 LAB — CYTOLOGY - PAP
Adequacy: ABSENT
Chlamydia: NEGATIVE
Comment: NEGATIVE
Comment: NEGATIVE
Comment: NEGATIVE
Comment: NORMAL
Diagnosis: NEGATIVE
High risk HPV: NEGATIVE
Neisseria Gonorrhea: NEGATIVE
Trichomonas: NEGATIVE

## 2023-10-11 ENCOUNTER — Ambulatory Visit: Payer: Managed Care, Other (non HMO) | Admitting: Family

## 2023-11-12 ENCOUNTER — Telehealth: Payer: Self-pay

## 2023-11-12 ENCOUNTER — Encounter: Payer: Self-pay | Admitting: Family

## 2023-11-12 DIAGNOSIS — J301 Allergic rhinitis due to pollen: Secondary | ICD-10-CM

## 2023-11-12 DIAGNOSIS — R7303 Prediabetes: Secondary | ICD-10-CM

## 2023-11-12 DIAGNOSIS — E663 Overweight: Secondary | ICD-10-CM

## 2023-11-12 DIAGNOSIS — Z9189 Other specified personal risk factors, not elsewhere classified: Secondary | ICD-10-CM

## 2023-11-12 MED ORDER — FLUTICASONE PROPIONATE 50 MCG/ACT NA SUSP
1.0000 | Freq: Every day | NASAL | 1 refills | Status: AC | PRN
Start: 2023-11-12 — End: ?

## 2023-11-12 MED ORDER — WEGOVY 2.4 MG/0.75ML ~~LOC~~ SOAJ: 2.4000 mg | SUBCUTANEOUS | 1 refills | Status: DC

## 2023-11-12 NOTE — Telephone Encounter (Signed)
*  Primary  Pharmacy Patient Advocate Encounter   Received notification from CoverMyMeds that prior authorization for Wegovy  2.4MG /0.75ML auto-injectors  is required/requested.   Insurance verification completed.   The patient is insured through [facility] .   Per test claim: PA required; PA submitted to above mentioned insurance via CoverMyMeds Key/confirmation #/EOC [facility] Status is pending

## 2023-11-15 ENCOUNTER — Encounter: Payer: Self-pay | Admitting: Family

## 2023-11-15 DIAGNOSIS — Z1231 Encounter for screening mammogram for malignant neoplasm of breast: Secondary | ICD-10-CM

## 2023-11-21 ENCOUNTER — Other Ambulatory Visit (HOSPITAL_COMMUNITY): Payer: Self-pay

## 2023-11-21 NOTE — Telephone Encounter (Signed)
Pharmacy Patient Advocate Encounter  Received notification from [facility] that Prior Authorization for [facility] 2.4MG /0.75ML auto-injectors  has been APPROVED from 11/12/2023 to 05/11/2024. Unable to obtain price due to refill too soon rejection, last fill date 11/13/2023 next available fill date02/02/2024

## 2023-12-05 ENCOUNTER — Ambulatory Visit
Admission: RE | Admit: 2023-12-05 | Discharge: 2023-12-05 | Disposition: A | Discharge status: Home / Self Care | Payer: Managed Care, Other (non HMO) | Source: Ambulatory Visit | Attending: Family | Admitting: Family

## 2023-12-05 DIAGNOSIS — Z1231 Encounter for screening mammogram for malignant neoplasm of breast: Secondary | ICD-10-CM | POA: Diagnosis present

## 2023-12-07 ENCOUNTER — Other Ambulatory Visit: Payer: Self-pay | Admitting: Family

## 2023-12-09 MED ORDER — LEVONORGEST-ETH ESTRAD 91-DAY 0.1-0.02 & 0.01 MG PO TABS: 1.0000 | ORAL_TABLET | Freq: Every day | ORAL | 1 refills | Status: DC

## 2023-12-10 ENCOUNTER — Encounter: Payer: Self-pay | Admitting: Family

## 2024-01-20 ENCOUNTER — Encounter: Payer: Self-pay | Admitting: Family

## 2024-01-20 DIAGNOSIS — M5412 Radiculopathy, cervical region: Secondary | ICD-10-CM

## 2024-01-22 MED ORDER — TIZANIDINE HCL 2 MG PO TABS
ORAL_TABLET | ORAL | 0 refills | Status: DC
Start: 2024-01-22 — End: 2024-06-03

## 2024-05-12 ENCOUNTER — Encounter: Payer: Self-pay | Admitting: Family

## 2024-05-13 MED ORDER — LEVONORGEST-ETH ESTRAD 91-DAY 0.1-0.02 & 0.01 MG PO TABS: 1.0000 | ORAL_TABLET | Freq: Every day | ORAL | 0 refills | Status: DC

## 2024-06-03 ENCOUNTER — Encounter: Payer: Self-pay | Admitting: Family

## 2024-06-03 DIAGNOSIS — M5412 Radiculopathy, cervical region: Secondary | ICD-10-CM

## 2024-06-03 MED ORDER — TIZANIDINE HCL 2 MG PO TABS: ORAL_TABLET | ORAL | 3 refills | Status: AC

## 2024-06-15 ENCOUNTER — Encounter: Payer: Self-pay | Admitting: Family

## 2024-06-15 ENCOUNTER — Other Ambulatory Visit: Payer: Self-pay | Admitting: *Deleted

## 2024-06-15 DIAGNOSIS — Z9189 Other specified personal risk factors, not elsewhere classified: Secondary | ICD-10-CM

## 2024-06-15 DIAGNOSIS — E663 Overweight: Secondary | ICD-10-CM

## 2024-06-15 DIAGNOSIS — E039 Hypothyroidism, unspecified: Secondary | ICD-10-CM

## 2024-06-15 DIAGNOSIS — R7303 Prediabetes: Secondary | ICD-10-CM

## 2024-06-15 MED ORDER — LEVOTHYROXINE SODIUM 50 MCG PO TABS: 50.0000 ug | ORAL_TABLET | Freq: Every day | ORAL | 0 refills | Status: DC

## 2024-06-16 ENCOUNTER — Telehealth: Payer: Self-pay | Admitting: Family

## 2024-06-16 DIAGNOSIS — E039 Hypothyroidism, unspecified: Secondary | ICD-10-CM

## 2024-06-16 MED ORDER — LEVOTHYROXINE SODIUM 50 MCG PO TABS: 50.0000 ug | ORAL_TABLET | Freq: Every day | ORAL | 0 refills | Status: DC

## 2024-06-16 NOTE — Telephone Encounter (Signed)
 Rx has been resent

## 2024-06-16 NOTE — Telephone Encounter (Signed)
 Copied from CRM [id number]. Topic: Clinical - Prescription Issue >> Jun 16, 2024  2:31 PM Henretta I wrote: Reason for CRM: [facility] called because they are not seeing prescription sent in for patients levothyroxine  (SYNTHROID ) 50 MCG tablet, even though it is showing it was sent yesterday. Can [facility] get a call and received this over the phone or can it be refaxed to them.  [id number] [place] [place], [place] - [address] AT [facility] [address] [place], [place] [address] Phone: [phone]  Fax: [phone]

## 2024-06-17 NOTE — Addendum Note (Signed)
 Addended by: ALBINO SHAVER C on: 06/17/2024 08:26 AM   Modules accepted: Orders

## 2024-06-18 MED ORDER — WEGOVY 2.4 MG/0.75ML ~~LOC~~ SOAJ: 2.4000 mg | SUBCUTANEOUS | 0 refills | Status: DC

## 2024-08-31 ENCOUNTER — Other Ambulatory Visit: Payer: Self-pay | Admitting: *Deleted

## 2024-08-31 MED ORDER — LEVONORGEST-ETH ESTRAD 91-DAY 0.1-0.02 & 0.01 MG PO TABS: 1.0000 | ORAL_TABLET | Freq: Every day | ORAL | 0 refills | Status: DC

## 2024-09-10 ENCOUNTER — Encounter: Payer: Self-pay | Admitting: Family

## 2024-09-10 ENCOUNTER — Ambulatory Visit (INDEPENDENT_AMBULATORY_CARE_PROVIDER_SITE_OTHER): Admitting: Family

## 2024-09-10 VITALS — BP 108/72 | HR 94 | Temp 98.6°F | Ht 64.0 in | Wt 147.6 lb

## 2024-09-10 DIAGNOSIS — E039 Hypothyroidism, unspecified: Secondary | ICD-10-CM | POA: Diagnosis not present

## 2024-09-10 DIAGNOSIS — Z0000 Encounter for general adult medical examination without abnormal findings: Secondary | ICD-10-CM | POA: Diagnosis not present

## 2024-09-10 DIAGNOSIS — F411 Generalized anxiety disorder: Secondary | ICD-10-CM

## 2024-09-10 MED ORDER — SERTRALINE HCL 50 MG PO TABS: 50.0000 mg | ORAL_TABLET | Freq: Every day | ORAL | 3 refills | Status: AC

## 2024-09-10 NOTE — Patient Instructions (Signed)
------------------------------------    Start sertraline 50 mg for anxiety and depression. Take 1/2 tablet by mouth once daily for about one week, then increase to 1 full tablet thereafter.   Taking the medicine as directed and not missing any doses is one of the best things you can do to treat your anxiety/depression.  Here are some things to keep in mind:  Side effects (stomach upset, some increased anxiety) may happen before you notice a benefit.  These side effects typically go away over time. Changes to your dose of medicine or a change in medication all together is sometimes necessary Many people will notice an improvement within two weeks but the full effect of the medication can take up to 4-6 weeks Stopping the medication when you start feeling better often results in a return of symptoms. Most people need to be on medication at least 6-12 months If you start having thoughts of hurting yourself or others after starting this medicine, please call me immediately.    ------------------------------------

## 2024-09-10 NOTE — Progress Notes (Signed)
 Subjective:  Patient ID: Ann Cooper, female    DOB: 1986-05-22  Age: 38 y.o. MRN: [id number]  Patient Care Team: Corwin Antu, FNP as PCP - General (Family Medicine)   CC:  Chief Complaint  Patient presents with   Annual Exam    HPI Ann Cooper is a 38 y.o. female who presents today for an annual physical exam. She reports consuming a general diet. Home exercise routine includes walking three times a week at least 30 min at a time. She generally feels well. She reports sleeping fairly well. She does not have additional problems to discuss today.   Vision:Within last year Dental:Receives regular dental care  Mammogram:  Last pap:  Colonoscopy: Bone density scan:  Pt is without acute concerns.   Discussed the use of AI scribe software for clinical note transcription with the patient, who gave verbal consent to proceed.  History of Present Illness Ann Cooper is a 38 year old female who presents with concerns about elevated cholesterol levels and anxiety.  She is concerned about her cholesterol levels after receiving wellness results from [facility], showing an LDL cholesterol of 162 mg/dL and total cholesterol of 236 mg/dL. Triglycerides and HDL are within normal limits. She avoids fried and fatty foods and reports that her mother also had high cholesterol.  She has been experiencing increased anxiety and a short temper, attributing these to the stress of balancing work and home life. She feels overwhelmed and exhausted, describing it as 'a lot with work and home.' She has a history of anxiety and previously found Zoloft helpful during her parents' divorce at age 21. 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 She manages household responsibilities by setting practical daily goals, such as doing two loads of laundry and scheduling time with her child.  She  reports difficulty falling asleep but finds trazodone  helpful. She tries to walk at least three times a week for exercise and maintains regular bowel movements by eating cashews for fiber.  She is considering discontinuing birth control as her husband plans to get a vasectomy. She notes irregular periods and suspects perimenopause, with changes in skin and hair texture. Her mother underwent a hysterectomy at age 4, leading to surgical menopause.  She has been taking her thyroid medication as prescribed but has not had her thyroid levels checked recently. Recent lab work included cholesterol and glucose levels, with an A1c of 5.1%. She has not had a metabolic panel or CBC recently.  . Wt Readings from Last 3 Encounters:  09/10/24 147 lb 9.6 oz (67 kg)  07/31/23 140 lb 12.8 oz (63.9 kg)  07/04/23 138 lb (62.6 kg)     Advanced Directives Patient does not have advanced directives    DEPRESSION SCREENING    09/10/2024    9:35 AM 06/25/2023    8:33 AM 04/03/2023    8:05 AM 11/14/2022    9:13 AM 07/13/2021    9:38 AM 09/30/2020    1:37 PM 12/16/2018    9:38 AM  PHQ 2/9 Scores  PHQ - 2 Score 0 0 0 0 0 0 0  PHQ- 9 Score 2 3   2     0      Data saved with a previous flowsheet row definition     ROS: Negative unless specifically indicated above in HPI.    Current Outpatient Medications:    fluticasone  (  FLONASE ) 50 MCG/ACT nasal spray, Place 1 spray into both nostrils daily as needed., Disp: 16 mL, Rfl: 1   Levonorgestrel-Ethinyl Estradiol (AMETHIA) 0.1-0.02 & 0.01 MG tablet, Take 1 tablet by mouth daily., Disp: 91 tablet, Rfl: 0   levothyroxine  (SYNTHROID ) 50 MCG tablet, Take 1 tablet (50 mcg total) by mouth daily., Disp: 90 tablet, Rfl: 0   semaglutide -weight management (WEGOVY ) 2.4 MG/0.75ML SOAJ SQ injection, Inject 2.4 mg into the skin once a week., Disp: 9 mL, Rfl: 0   sertraline (ZOLOFT) 50 MG tablet, Take 1 tablet (50 mg total) by mouth daily., Disp: 30 tablet, Rfl: 3   tiZANidine   (ZANAFLEX ) 2 MG tablet, Take one po at bedtime prn muscle spasm, Disp: 30 tablet, Rfl: 3   traZODone  (DESYREL ) 50 MG tablet, Take 0.5-1 tablets (25-50 mg total) by mouth at bedtime as needed for sleep., Disp: 90 tablet, Rfl: 3    Objective:    BP 108/72 (BP Location: Right Arm, Patient Position: Sitting, Cuff Size: Normal)   Pulse 94   Temp 98.6 F (37 C) (Temporal)   Ht 5' 4 (1.626 m)   Wt 147 lb 9.6 oz (67 kg)   LMP 09/03/2024 (Exact Date)   SpO2 99%   BMI 25.34 kg/m   BP Readings from Last 3 Encounters:  09/10/24 108/72  07/31/23 102/62  07/08/23 103/70      Physical Exam Vitals reviewed.  Constitutional:      General: She is not in acute distress.    Appearance: Normal appearance. She is normal weight. She is not ill-appearing.  HENT:     Head: Normocephalic.     Right Ear: Tympanic membrane normal.     Left Ear: Tympanic membrane normal.     Nose: Nose normal.     Mouth/Throat:     Mouth: Mucous membranes are moist.  Eyes:     Extraocular Movements: Extraocular movements intact.     Pupils: Pupils are equal, round, and reactive to light.  Cardiovascular:     Rate and Rhythm: Normal rate and regular rhythm.  Pulmonary:     Effort: Pulmonary effort is normal.     Breath sounds: Normal breath sounds.  Abdominal:     General: Abdomen is flat. Bowel sounds are normal.     Palpations: Abdomen is soft.     Tenderness: There is no guarding or rebound.  Musculoskeletal:        General: Normal range of motion.     Cervical back: Normal range of motion.  Skin:    General: Skin is warm.     Capillary Refill: Capillary refill takes less than 2 seconds.  Neurological:     General: No focal deficit present.     Mental Status: She is alert.  Psychiatric:        Mood and Affect: Mood is depressed. Affect is tearful.        Behavior: Behavior normal.        Thought Content: Thought content normal. Thought content does not include homicidal or suicidal ideation. Thought  content does not include homicidal or suicidal plan.        Judgment: Judgment normal.       Results LABS LDL: 162 mg/dL Total Cholesterol: 763 mg/dL HDL: 57 mg/dL Hemoglobin J8r: 4.8%      Assessment & Plan:   Assessment and Plan Assessment & Plan Adult Wellness Visit Routine adult wellness visit with slightly elevated cholesterol levels. LDL is 162 mg/dL, total cholesterol is 763 mg/dL,  triglycerides are normal, and HDL is 57 mg/dL. Atherosclerotic cardiovascular disease risk is low at 0.5%. No need for statin therapy due to low risk. Regular exercise and healthy diet recommended. Up to date with eye and dental exams. Received flu shot in October. Discussed HPV vaccination status and confirmed previous vaccination. - Continue regular exercise and healthy diet. - Monitor cholesterol levels. - Ensure up-to-date eye and dental exams. - Confirmed HPV vaccination status. -Patient Counseling(The following topics were reviewed):  Preventative care handout given to pt  Health maintenance and immunizations reviewed. Please refer to Health maintenance section. Pt advised on safe sex, wearing seatbelts in car, and proper nutrition labwork ordered today for annual Dental health: Discussed importance of regular tooth brushing, flossing, and dental visits.   Generalized anxiety disorder Experiencing increased anxiety, particularly related to work and home stressors. Anxiety is manageable but affects daily life. Previously on Zoloft with positive effects. Discussed potential benefits of therapy and medication. Zoloft prescribed for temporary use to manage anxiety symptoms. - Prescribed Zoloft, starting with half tablet for the first week, then one tablet daily. - Referred to therapy for additional support. - Scheduled follow-up in four weeks to assess response to treatment.  Insomnia Difficulty falling asleep, managed with trazodone  at bedtime. Reports trazodone  is helpful in initiating  sleep. - Continue trazodone  at bedtime as needed.  Acquired hypothyroidism On levothyroxine  for hypothyroidism. Thyroid function has not been checked recently. Discussed importance of annual thyroid function tests. - Ordered thyroid function test.  Pure hypercholesterolemia Elevated LDL and total cholesterol levels. Family history suggests possible familial hypercholesterolemia. Low cardiovascular risk, no statin therapy needed. Discussed dietary modifications to manage cholesterol levels. - Provided dietary guidance to reduce cholesterol intake. - Monitor cholesterol levels.  Irregular menstruation Irregular periods, possibly related to perimenopause. Reports changes in skin and hair texture. Thyroid function may contribute to symptoms. - Ordered thyroid function test to assess for potential thyroid-related causes of irregular menstruation.          Follow-up: Return in about 6 weeks (around 10/22/2024) for f/u anxiety.   Ginger Patrick, FNP

## 2024-09-11 ENCOUNTER — Other Ambulatory Visit (HOSPITAL_COMMUNITY): Payer: Self-pay

## 2024-09-11 ENCOUNTER — Ambulatory Visit: Payer: Self-pay | Admitting: Family

## 2024-09-12 LAB — BASIC METABOLIC PANEL WITH GFR
BUN/Creatinine Ratio: 19 (ref 9–23)
BUN: 14 mg/dL (ref 6–24)
CO2: 24 mmol/L (ref 20–29)
Calcium: 9.6 mg/dL (ref 8.7–10.2)
Chloride: 103 mmol/L (ref 96–106)
Creatinine, Ser: 0.73 mg/dL (ref 0.57–1.00)
Glucose: 91 mg/dL (ref 70–99)
Potassium: 4.5 mmol/L (ref 3.5–5.2)
Sodium: 139 mmol/L (ref 134–144)
eGFR: 107 mL/min/1.73 (ref >59)

## 2024-09-12 LAB — CBC
Hematocrit: 44.4 % (ref 34.0–46.6)
Hemoglobin: 15 g/dL (ref 11.1–15.9)
MCH: 31.9 pg (ref 26.6–33.0)
MCHC: 33.8 g/dL (ref 31.5–35.7)
MCV: 95 fL (ref 79–97)
Platelets: 204 x10E3/uL (ref 150–450)
RBC: 4.7 x10E6/uL (ref 3.77–5.28)
RDW: 11.6 % — ABNORMAL LOW (ref 11.7–15.4)
WBC: 4.3 x10E3/uL (ref 3.4–10.8)

## 2024-09-12 LAB — TSH: TSH: 0.913 u[IU]/mL (ref 0.450–4.500)

## 2024-09-15 ENCOUNTER — Encounter: Payer: Self-pay | Admitting: Family

## 2024-09-15 DIAGNOSIS — F411 Generalized anxiety disorder: Secondary | ICD-10-CM

## 2024-09-17 MED ORDER — HYDROXYZINE PAMOATE 25 MG PO CAPS: ORAL_CAPSULE | ORAL | 0 refills | Status: AC

## 2024-10-19 ENCOUNTER — Other Ambulatory Visit: Payer: Self-pay | Admitting: *Deleted

## 2024-10-19 ENCOUNTER — Ambulatory Visit: Admitting: Family

## 2024-10-19 DIAGNOSIS — E663 Overweight: Secondary | ICD-10-CM

## 2024-10-19 DIAGNOSIS — Z9189 Other specified personal risk factors, not elsewhere classified: Secondary | ICD-10-CM

## 2024-10-19 DIAGNOSIS — R7303 Prediabetes: Secondary | ICD-10-CM

## 2024-10-19 MED ORDER — WEGOVY 2.4 MG/0.75ML ~~LOC~~ SOAJ: 2.4000 mg | SUBCUTANEOUS | 0 refills | Status: AC

## 2024-10-20 ENCOUNTER — Telehealth: Payer: Self-pay

## 2024-10-20 ENCOUNTER — Other Ambulatory Visit (HOSPITAL_COMMUNITY): Payer: Self-pay

## 2024-10-20 NOTE — Telephone Encounter (Signed)
 Pharmacy Patient Advocate Encounter   Received notification from Onbase that prior authorization for Wegovy  2.4 is required/requested.   Insurance verification completed.   The patient is insured through [facility].   Per test claim: PA required; PA submitted to above mentioned insurance via Latent Key/confirmation #/EOC [id number] Status is pending

## 2024-10-27 ENCOUNTER — Other Ambulatory Visit (HOSPITAL_COMMUNITY): Payer: Self-pay

## 2024-10-27 NOTE — Telephone Encounter (Signed)
 Pharmacy Patient Advocate Encounter  Received notification from [employer] that Prior Authorization for Wegovy  2.4 has been APPROVED from 10/20/24 to 10/20/25. Unable to obtain price due to refill too soon rejection, last fill date 10/23/24 next available fill date2/27/26   PA #/Case ID/Reference #: # [id number]

## 2024-10-28 ENCOUNTER — Ambulatory Visit: Admitting: Clinical

## 2024-10-28 DIAGNOSIS — F4322 Adjustment disorder with anxiety: Secondary | ICD-10-CM

## 2024-10-28 NOTE — Progress Notes (Signed)
   Darice Seats, LCSW
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
## 2024-11-04 ENCOUNTER — Other Ambulatory Visit: Payer: Self-pay | Admitting: Family

## 2024-11-04 DIAGNOSIS — Z1231 Encounter for screening mammogram for malignant neoplasm of breast: Secondary | ICD-10-CM

## 2024-11-25 ENCOUNTER — Other Ambulatory Visit: Payer: Self-pay | Admitting: *Deleted

## 2024-11-25 DIAGNOSIS — F5102 Adjustment insomnia: Secondary | ICD-10-CM

## 2024-11-25 MED ORDER — TRAZODONE HCL 50 MG PO TABS: 25.0000 mg | ORAL_TABLET | Freq: Every evening | ORAL | 1 refills | Status: AC | PRN

## 2024-11-27 ENCOUNTER — Other Ambulatory Visit: Payer: Self-pay | Admitting: *Deleted

## 2024-11-27 DIAGNOSIS — E039 Hypothyroidism, unspecified: Secondary | ICD-10-CM

## 2024-11-27 MED ORDER — LEVONORGEST-ETH ESTRAD 91-DAY 0.1-0.02 & 0.01 MG PO TABS: 1.0000 | ORAL_TABLET | Freq: Every day | ORAL | 4 refills | Status: AC

## 2024-11-27 MED ORDER — LEVOTHYROXINE SODIUM 50 MCG PO TABS: 50.0000 ug | ORAL_TABLET | Freq: Every day | ORAL | 1 refills | Status: AC

## 2024-12-08 ENCOUNTER — Ambulatory Visit: Payer: Self-pay | Admitting: Clinical

## 2024-12-24 ENCOUNTER — Ambulatory Visit: Admitting: Clinical
